# Patient Record
Sex: Female | Born: 1970 | Race: White | Hispanic: No | Marital: Married | Smoking: Never smoker
Health system: Southern US, Community
[De-identification: ages and names within clinical notes are randomized; demographics above are authoritative.]

## PROBLEM LIST (undated history)

## (undated) DIAGNOSIS — E039 Hypothyroidism, unspecified: Secondary | ICD-10-CM

## (undated) DIAGNOSIS — Z803 Family history of malignant neoplasm of breast: Secondary | ICD-10-CM

## (undated) DIAGNOSIS — C50919 Malignant neoplasm of unspecified site of unspecified female breast: Secondary | ICD-10-CM

## (undated) HISTORY — DX: Family history of malignant neoplasm of breast: Z80.3

## (undated) HISTORY — DX: Malignant neoplasm of unspecified site of unspecified female breast: C50.919

---

## 1998-02-25 ENCOUNTER — Other Ambulatory Visit: Admission: RE | Admit: 1998-02-25 | Discharge: 1998-02-25 | Payer: Self-pay | Admitting: *Deleted

## 1999-09-11 ENCOUNTER — Inpatient Hospital Stay (HOSPITAL_COMMUNITY): Admission: AD | Admit: 1999-09-11 | Discharge: 1999-09-11 | Payer: Self-pay | Admitting: *Deleted

## 1999-09-11 ENCOUNTER — Inpatient Hospital Stay (HOSPITAL_COMMUNITY): Admission: AD | Admit: 1999-09-11 | Discharge: 1999-09-16 | Payer: Self-pay | Admitting: *Deleted

## 1999-09-17 ENCOUNTER — Encounter: Admission: RE | Admit: 1999-09-17 | Discharge: 1999-12-16 | Payer: Self-pay | Admitting: *Deleted

## 1999-12-17 ENCOUNTER — Encounter: Admission: RE | Admit: 1999-12-17 | Discharge: 2000-03-16 | Payer: Self-pay | Admitting: *Deleted

## 2000-04-16 ENCOUNTER — Encounter: Admission: RE | Admit: 2000-04-16 | Discharge: 2000-05-16 | Payer: Self-pay | Admitting: *Deleted

## 2000-10-12 ENCOUNTER — Ambulatory Visit (HOSPITAL_COMMUNITY): Admission: RE | Admit: 2000-10-12 | Discharge: 2000-10-12 | Payer: Self-pay | Admitting: Obstetrics and Gynecology

## 2001-03-01 ENCOUNTER — Inpatient Hospital Stay (HOSPITAL_COMMUNITY): Admission: AD | Admit: 2001-03-01 | Discharge: 2001-03-01 | Payer: Self-pay | Admitting: Obstetrics and Gynecology

## 2001-03-01 ENCOUNTER — Encounter: Payer: Self-pay | Admitting: Obstetrics and Gynecology

## 2001-09-09 ENCOUNTER — Inpatient Hospital Stay (HOSPITAL_COMMUNITY): Admission: AD | Admit: 2001-09-09 | Discharge: 2001-09-12 | Payer: Self-pay | Admitting: Obstetrics and Gynecology

## 2001-09-13 ENCOUNTER — Encounter: Admission: RE | Admit: 2001-09-13 | Discharge: 2001-10-13 | Payer: Self-pay | Admitting: Obstetrics and Gynecology

## 2001-10-14 ENCOUNTER — Encounter: Admission: RE | Admit: 2001-10-14 | Discharge: 2001-11-13 | Payer: Self-pay | Admitting: Obstetrics and Gynecology

## 2001-12-14 ENCOUNTER — Encounter: Admission: RE | Admit: 2001-12-14 | Discharge: 2002-01-13 | Payer: Self-pay | Admitting: Obstetrics and Gynecology

## 2002-02-13 ENCOUNTER — Encounter: Admission: RE | Admit: 2002-02-13 | Discharge: 2002-03-15 | Payer: Self-pay | Admitting: Obstetrics and Gynecology

## 2002-10-22 ENCOUNTER — Other Ambulatory Visit: Admission: RE | Admit: 2002-10-22 | Discharge: 2002-10-22 | Payer: Self-pay | Admitting: Obstetrics and Gynecology

## 2003-04-27 ENCOUNTER — Other Ambulatory Visit: Admission: RE | Admit: 2003-04-27 | Discharge: 2003-04-27 | Payer: Self-pay | Admitting: Obstetrics and Gynecology

## 2003-06-10 ENCOUNTER — Inpatient Hospital Stay (HOSPITAL_COMMUNITY): Admission: AD | Admit: 2003-06-10 | Discharge: 2003-06-10 | Payer: Self-pay | Admitting: Obstetrics and Gynecology

## 2003-10-20 ENCOUNTER — Other Ambulatory Visit: Admission: RE | Admit: 2003-10-20 | Discharge: 2003-10-20 | Payer: Self-pay | Admitting: Obstetrics and Gynecology

## 2004-01-01 ENCOUNTER — Other Ambulatory Visit: Admission: RE | Admit: 2004-01-01 | Discharge: 2004-01-01 | Payer: Self-pay | Admitting: Obstetrics & Gynecology

## 2004-03-21 ENCOUNTER — Other Ambulatory Visit: Admission: RE | Admit: 2004-03-21 | Discharge: 2004-03-21 | Payer: Self-pay | Admitting: Obstetrics and Gynecology

## 2004-09-20 ENCOUNTER — Other Ambulatory Visit: Admission: RE | Admit: 2004-09-20 | Discharge: 2004-09-20 | Payer: Self-pay | Admitting: Obstetrics and Gynecology

## 2015-01-17 HISTORY — PX: BREAST BIOPSY: SHX20

## 2015-06-02 ENCOUNTER — Other Ambulatory Visit: Payer: Self-pay | Admitting: Obstetrics and Gynecology

## 2015-06-02 DIAGNOSIS — Z803 Family history of malignant neoplasm of breast: Secondary | ICD-10-CM

## 2015-06-03 ENCOUNTER — Ambulatory Visit
Admission: RE | Admit: 2015-06-03 | Discharge: 2015-06-03 | Disposition: A | Discharge status: Home / Self Care | Payer: Managed Care, Other (non HMO) | Source: Ambulatory Visit | Attending: Obstetrics and Gynecology | Admitting: Obstetrics and Gynecology

## 2015-06-03 DIAGNOSIS — Z803 Family history of malignant neoplasm of breast: Secondary | ICD-10-CM

## 2015-06-03 MED ORDER — GADOBENATE DIMEGLUMINE 529 MG/ML IV SOLN
11.0000 mL | Freq: Once | INTRAVENOUS | Status: AC | PRN
  Administered 2015-06-03: 11 mL via INTRAVENOUS

## 2015-06-15 ENCOUNTER — Other Ambulatory Visit: Payer: Self-pay | Admitting: Obstetrics and Gynecology

## 2015-06-15 DIAGNOSIS — N63 Unspecified lump in unspecified breast: Secondary | ICD-10-CM

## 2015-06-17 ENCOUNTER — Ambulatory Visit
Admission: RE | Admit: 2015-06-17 | Discharge: 2015-06-17 | Disposition: A | Discharge status: Home / Self Care | Payer: Managed Care, Other (non HMO) | Source: Ambulatory Visit | Attending: Obstetrics and Gynecology | Admitting: Obstetrics and Gynecology

## 2015-06-17 DIAGNOSIS — N63 Unspecified lump in unspecified breast: Secondary | ICD-10-CM

## 2015-06-17 MED ORDER — GADOBENATE DIMEGLUMINE 529 MG/ML IV SOLN
11.0000 mL | Freq: Once | INTRAVENOUS | Status: AC | PRN
  Administered 2015-06-17: 11 mL via INTRAVENOUS

## 2015-06-21 ENCOUNTER — Other Ambulatory Visit: Payer: Managed Care, Other (non HMO)

## 2015-12-20 ENCOUNTER — Other Ambulatory Visit: Payer: Self-pay | Admitting: Obstetrics and Gynecology

## 2015-12-20 DIAGNOSIS — R928 Other abnormal and inconclusive findings on diagnostic imaging of breast: Secondary | ICD-10-CM

## 2015-12-29 ENCOUNTER — Other Ambulatory Visit: Payer: Managed Care, Other (non HMO)

## 2016-06-27 ENCOUNTER — Other Ambulatory Visit: Payer: Self-pay | Admitting: Obstetrics and Gynecology

## 2016-06-27 DIAGNOSIS — Z803 Family history of malignant neoplasm of breast: Secondary | ICD-10-CM

## 2016-07-16 ENCOUNTER — Ambulatory Visit
Admission: RE | Admit: 2016-07-16 | Discharge: 2016-07-16 | Disposition: A | Discharge status: Home / Self Care | Payer: Managed Care, Other (non HMO) | Source: Ambulatory Visit | Attending: Obstetrics and Gynecology | Admitting: Obstetrics and Gynecology

## 2016-07-16 DIAGNOSIS — Z803 Family history of malignant neoplasm of breast: Secondary | ICD-10-CM

## 2016-07-16 MED ORDER — GADOBENATE DIMEGLUMINE 529 MG/ML IV SOLN
11.0000 mL | Freq: Once | INTRAVENOUS | Status: AC | PRN
  Administered 2016-07-16: 11 mL via INTRAVENOUS

## 2017-10-03 ENCOUNTER — Other Ambulatory Visit: Payer: Self-pay | Admitting: Obstetrics and Gynecology

## 2017-10-03 DIAGNOSIS — Z803 Family history of malignant neoplasm of breast: Secondary | ICD-10-CM

## 2017-11-30 ENCOUNTER — Ambulatory Visit (INDEPENDENT_AMBULATORY_CARE_PROVIDER_SITE_OTHER): Payer: Self-pay | Admitting: Plastic Surgery

## 2017-11-30 ENCOUNTER — Encounter: Payer: Self-pay | Admitting: Plastic Surgery

## 2017-11-30 DIAGNOSIS — Z719 Counseling, unspecified: Secondary | ICD-10-CM | POA: Insufficient documentation

## 2017-11-30 NOTE — Progress Notes (Signed)
Botulinum Toxin Procedure Note  Procedure: Cosmetic botulinum toxin  Pre-operative Diagnosis: Dynamic rhytides  Post-operative Diagnosis: Same  Complications:  None  Brief history: The patient desires botulinum toxin injection of her forehead. I discussed with the patient this proposed procedure of botulinum toxin injections, which is customized depending on the particular needs of the patient. It is performed on facial rhytids as a temporary correction. The alternatives were discussed with the patient. The risks were addressed including bleeding, scarring, infection, damage to deeper structures, asymmetry, and chronic pain, which may occur infrequently after a procedure. The individual's choice to undergo a surgical procedure is based on the comparison of risks to potential benefits. Other risks include unsatisfactory results, brow ptosis, eyelid ptosis, allergic reaction, temporary paralysis, which should go away with time, bruising, blurring disturbances and delayed healing. Botulinum toxin injections do not arrest the aging process or produce permanent tightening of the eyelid.  Operative intervention maybe necessary to maintain the results of a blepharoplasty or botulinum toxin. The patient understands and wishes to proceed. An informed consent was signed and informational brochures given to her prior to the procedure.  Procedure: The area was prepped with alcohol and dried with a clean gauze. Using a clean technique, the botulinum toxin was diluted with 1.25 cc of preservative-free normal saline which was slowly injected with an 18 gauge needle in a tuberculin syringes.  A 32 gauge needles were then used to inject the botulinum toxin. This mixture allow for an aliquot of 5 units per 0.1 cc in each injection site.    Subsequently the mixture was injected in the glabellar and forehead area with preservation of the temporal branch to the lateral eyebrow as well as into each lateral canthal area  beginning from the lateral orbital rim medial to the zygomaticus major in 3 separate areas. A total of 30 Units of botulinum toxin was used. The forehead and glabellar area was injected with care to inject intramuscular only while holding pressure on the supratrochlear vessels in each area during each injection on either side of the medial corrugators. The injection proceeded vertically superiorly to the medial 2/3 of the frontalis muscle and superior 2/3 of the lateral frontalis, again with preservation of the frontal branch. Light pressure was held for 5 minutes. She was instructed explicitly in post-operative care.  Botox LOT:  [id number] [id number] EXP:  3/22

## 2018-02-22 ENCOUNTER — Ambulatory Visit
Admission: RE | Admit: 2018-02-22 | Discharge: 2018-02-22 | Disposition: A | Discharge status: Home / Self Care | Payer: Managed Care, Other (non HMO) | Source: Ambulatory Visit | Attending: Obstetrics and Gynecology | Admitting: Obstetrics and Gynecology

## 2018-02-22 DIAGNOSIS — Z803 Family history of malignant neoplasm of breast: Secondary | ICD-10-CM

## 2018-02-22 MED ORDER — GADOBUTROL 1 MMOL/ML IV SOLN
6.0000 mL | Freq: Once | INTRAVENOUS | Status: AC | PRN
  Administered 2018-02-22: 6 mL via INTRAVENOUS

## 2018-02-23 ENCOUNTER — Other Ambulatory Visit: Payer: Managed Care, Other (non HMO)

## 2019-04-03 ENCOUNTER — Ambulatory Visit: Payer: Managed Care, Other (non HMO) | Attending: Family

## 2019-04-03 DIAGNOSIS — Z23 Encounter for immunization: Secondary | ICD-10-CM

## 2019-04-03 NOTE — Progress Notes (Signed)
   Covid-19 Vaccination Clinic  Name:  Tyreesha Wahl    MRN: [id number] DOB: 1970-06-18  04/03/2019  Ms. Odoherty was observed post Covid-19 immunization for 15 minutes without incident. She was provided with Vaccine Information Sheet and instruction to access the V-Safe system.   Ms. Cofrancesco was instructed to call 911 with any severe reactions post vaccine: Marland Kitchen Difficulty breathing  . Swelling of face and throat  . A fast heartbeat  . A bad rash all over body  . Dizziness and weakness   Immunizations Administered    Name Date Dose VIS Date Route   Moderna COVID-19 Vaccine 04/03/2019  2:53 PM 0.5 mL 12/17/2018 Intramuscular   Manufacturer: Moderna   Lot: [id number]   [id number]

## 2019-05-06 ENCOUNTER — Ambulatory Visit: Payer: Self-pay | Attending: Family

## 2019-05-06 DIAGNOSIS — Z23 Encounter for immunization: Secondary | ICD-10-CM

## 2019-05-06 NOTE — Progress Notes (Signed)
   Covid-19 Vaccination Clinic  Name:  Noralba Rosencrantz    MRN: [id number] DOB: Aug 11, 1970  05/06/2019  Ms. Lowe was observed post Covid-19 immunization for 15 minutes without incident. She was provided with Vaccine Information Sheet and instruction to access the V-Safe system.   Ms. Poore was instructed to call 911 with any severe reactions post vaccine: Marland Kitchen Difficulty breathing  . Swelling of face and throat  . A fast heartbeat  . A bad rash all over body  . Dizziness and weakness   Immunizations Administered    Name Date Dose VIS Date Route   Moderna COVID-19 Vaccine 05/06/2019 10:03 AM 0.5 mL 12/2018 Intramuscular   Manufacturer: Moderna   Lot: [id number]   [place][id number]

## 2020-05-12 ENCOUNTER — Other Ambulatory Visit: Payer: Self-pay | Admitting: Obstetrics and Gynecology

## 2020-05-12 DIAGNOSIS — Z803 Family history of malignant neoplasm of breast: Secondary | ICD-10-CM

## 2020-05-30 ENCOUNTER — Other Ambulatory Visit: Payer: Self-pay

## 2020-06-02 ENCOUNTER — Ambulatory Visit
Admission: RE | Admit: 2020-06-02 | Discharge: 2020-06-02 | Disposition: A | Discharge status: Home / Self Care | Payer: No Typology Code available for payment source | Source: Ambulatory Visit | Attending: Obstetrics and Gynecology | Admitting: Obstetrics and Gynecology

## 2020-06-02 ENCOUNTER — Other Ambulatory Visit: Payer: Self-pay

## 2020-06-02 DIAGNOSIS — Z803 Family history of malignant neoplasm of breast: Secondary | ICD-10-CM

## 2020-06-02 MED ORDER — GADOBUTROL 1 MMOL/ML IV SOLN
6.0000 mL | Freq: Once | INTRAVENOUS | Status: AC | PRN
  Administered 2020-06-02: 6 mL via INTRAVENOUS

## 2020-06-04 ENCOUNTER — Other Ambulatory Visit: Payer: Self-pay | Admitting: Obstetrics and Gynecology

## 2020-06-04 DIAGNOSIS — R9389 Abnormal findings on diagnostic imaging of other specified body structures: Secondary | ICD-10-CM

## 2020-06-10 ENCOUNTER — Ambulatory Visit
Admission: RE | Admit: 2020-06-10 | Discharge: 2020-06-10 | Disposition: A | Discharge status: Home / Self Care | Payer: No Typology Code available for payment source | Source: Ambulatory Visit | Attending: Obstetrics and Gynecology | Admitting: Obstetrics and Gynecology

## 2020-06-10 ENCOUNTER — Other Ambulatory Visit: Payer: Self-pay

## 2020-06-10 DIAGNOSIS — R9389 Abnormal findings on diagnostic imaging of other specified body structures: Secondary | ICD-10-CM

## 2020-06-11 ENCOUNTER — Other Ambulatory Visit: Payer: Self-pay | Admitting: Obstetrics and Gynecology

## 2020-06-11 DIAGNOSIS — N63 Unspecified lump in unspecified breast: Secondary | ICD-10-CM

## 2020-06-16 ENCOUNTER — Other Ambulatory Visit: Payer: No Typology Code available for payment source

## 2020-06-23 ENCOUNTER — Ambulatory Visit
Admission: RE | Admit: 2020-06-23 | Discharge: 2020-06-23 | Disposition: A | Discharge status: Home / Self Care | Payer: No Typology Code available for payment source | Source: Ambulatory Visit | Attending: Obstetrics and Gynecology | Admitting: Obstetrics and Gynecology

## 2020-06-23 ENCOUNTER — Other Ambulatory Visit (HOSPITAL_COMMUNITY): Payer: Self-pay | Admitting: Diagnostic Radiology

## 2020-06-23 ENCOUNTER — Other Ambulatory Visit: Payer: Self-pay

## 2020-06-23 DIAGNOSIS — N63 Unspecified lump in unspecified breast: Secondary | ICD-10-CM

## 2020-06-23 HISTORY — PX: BREAST BIOPSY: SHX20

## 2020-06-23 MED ORDER — GADOBUTROL 1 MMOL/ML IV SOLN
5.0000 mL | Freq: Once | INTRAVENOUS | Status: AC | PRN
  Administered 2020-06-23: 5 mL via INTRAVENOUS

## 2020-06-25 ENCOUNTER — Other Ambulatory Visit: Payer: Self-pay | Admitting: Obstetrics and Gynecology

## 2020-06-25 ENCOUNTER — Telehealth: Payer: Self-pay | Admitting: Hematology and Oncology

## 2020-06-25 DIAGNOSIS — C50912 Malignant neoplasm of unspecified site of left female breast: Secondary | ICD-10-CM

## 2020-06-25 NOTE — Telephone Encounter (Signed)
Spoke to patient to confirm morning [facility] appointment for 6/22, packet sent via email

## 2020-06-26 ENCOUNTER — Ambulatory Visit
Admission: RE | Admit: 2020-06-26 | Discharge: 2020-06-26 | Disposition: A | Discharge status: Home / Self Care | Payer: No Typology Code available for payment source | Source: Ambulatory Visit | Attending: Obstetrics and Gynecology | Admitting: Obstetrics and Gynecology

## 2020-06-26 ENCOUNTER — Encounter: Payer: Self-pay | Admitting: Radiology

## 2020-06-26 DIAGNOSIS — C50912 Malignant neoplasm of unspecified site of left female breast: Secondary | ICD-10-CM

## 2020-07-01 ENCOUNTER — Encounter: Payer: Self-pay | Admitting: *Deleted

## 2020-07-01 DIAGNOSIS — D0512 Intraductal carcinoma in situ of left breast: Secondary | ICD-10-CM

## 2020-07-06 NOTE — Progress Notes (Signed)
[place] CONSULT NOTE  Patient Care Team: Zane Herald, MD as PCP - General (Family Medicine) Rockwell Germany, RN as Oncology Nurse Navigator Mauro Kaufmann, RN as Oncology Nurse Navigator Donnie Mesa, MD as Consulting Physician (General Surgery) Nicholas Lose, MD as Consulting Physician (Hematology and Oncology) Kyung Rudd, MD as Consulting Physician (Radiation Oncology)  CHIEF COMPLAINTS/PURPOSE OF CONSULTATION:  Newly diagnosed left DCIS  HISTORY OF PRESENTING ILLNESS:  Amanda Hawkins 50 y.o. female is here because of recent diagnosis of DCIS of the left breast. She has a family history of breast cancer including her mother diagnosed at age 61. High-risk screening breast MRI on 06/03/20 showed indeterminate 0.6 cm oval enhancing mass in the central upper left breast and no evidence of malignancy in the right breast. 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 Diagnostic mammogram and [place] on 06/26/20 showed new biopsy clip within the retroareolar to slightly outer left breast, corresponding to the recent biopsy-proven DCIS and prominent post biopsy hematoma at and anterior to the biopsy site, and no evidence of malignancy in the right breast. She presents to the clinic today for initial evaluation and discussion of treatment options.   I reviewed her records extensively and collaborated the history with the patient.  SUMMARY OF ONCOLOGIC HISTORY: Oncology History  Ductal carcinoma in situ (DCIS) of left breast  06/23/2020 Initial Diagnosis   High-risk screening breast MRI on 06/03/20 showed indeterminate 0.6 cm oval enhancing mass in the central upper left breast and no evidence of malignancy in the right breast. 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      MEDICAL HISTORY:  Past Medical History:  Diagnosis Date   Breast cancer [facility])      SURGICAL HISTORY: Past Surgical History:  Procedure Laterality Date   BREAST BIOPSY Left 06/23/2020   MRI   BREAST BIOPSY Right 2017   CESAREAN SECTION      SOCIAL HISTORY: Social History   Socioeconomic History   Marital status: Married    Spouse name: Not on file   Number of children: Not on file   Years of education: Not on file   Highest education level: Not on file  Occupational History   Not on file  Tobacco Use   Smoking status: Never   Smokeless tobacco: Never  Substance and Sexual Activity   Alcohol use: Yes    Comment: 10   Drug use: Never   Sexual activity: Not on file  Other Topics Concern   Not on file  Social History Narrative   Not on file   Social Determinants of Health   Financial Resource Strain: Not on file  Food Insecurity: Not on file  Transportation Needs: Not on file  Physical Activity: Not on file  Stress: Not on file  Social Connections: Not on file  Intimate Partner Violence: Not on file    FAMILY HISTORY: Family History  Problem Relation Age of Onset   Breast cancer Mother 69    ALLERGIES:  has no allergies on file.  MEDICATIONS:  Current Outpatient Medications  Medication Sig Dispense Refill   TESTOSTERONE NA testosterone 2% cream  APPLY 1 OR 2 CLICKS (3.97-6.7 GRAMS) BEHIND THE KNEE DAILY.     thyroid (NP THYROID) 60 MG tablet NP Thyroid 60 mg tablet  TAKE 1 TABLET ONCE DAILY ON EVEN DAYS AND TAKE 1&1/2 TABLETS ONCE  DAILY ON ODD DAYS.     No current facility-administered medications for this visit.    REVIEW OF SYSTEMS:   Constitutional: Denies fevers, chills or abnormal night sweats Eyes: Denies blurriness of vision, double vision or watery eyes Ears, nose, mouth, throat, and face: Denies mucositis or sore throat Respiratory: Denies cough, dyspnea or wheezes Cardiovascular: Denies palpitation, chest discomfort or lower extremity swelling Gastrointestinal:  Denies nausea, heartburn or change in bowel habits Skin:  Denies abnormal skin rashes Lymphatics: Denies new lymphadenopathy or easy bruising Neurological:Denies numbness, tingling or new weaknesses Behavioral/Psych: Mood is stable, no new changes   All other systems were reviewed with the patient and are negative.  PHYSICAL EXAMINATION: ECOG PERFORMANCE STATUS: 0 - Asymptomatic  Vitals:   07/07/20 0851  BP: 117/86  Pulse: 99  Resp: 18  Temp: 97.8 F (36.6 C)  SpO2: 100%   Filed Weights   07/07/20 0851  Weight: 132 lb 6.4 oz (60.1 kg)      LABORATORY DATA:  I have reviewed the data as listed Lab Results  Component Value Date   WBC 6.5 07/07/2020   HGB 14.5 07/07/2020   HCT 42.9 07/07/2020   MCV 96.6 07/07/2020   PLT 181 07/07/2020   Lab Results  Component Value Date   NA 140 07/07/2020   K 4.3 07/07/2020   CL 105 07/07/2020   CO2 22 07/07/2020    RADIOGRAPHIC STUDIES: I have personally reviewed the radiological reports and agreed with the findings in the report.  ASSESSMENT AND PLAN:  Ductal carcinoma in situ (DCIS) of left breast 06/23/2020: High risk screening breast MRI revealed indeterminate 0.6 cm oval mass in the left breast.  Biopsy revealed low-grade DCIS ER/PR 100% positive.  Pathology review: I discussed with the patient the difference between DCIS and invasive breast cancer. It is considered a precancerous lesion. DCIS is classified as a 0. It is generally detected through mammograms as calcifications. We discussed the significance of grades and its impact on prognosis. We also discussed the importance of ER and PR receptors and their implications to adjuvant treatment options. Prognosis of DCIS dependence on grade, comedo necrosis. It is anticipated that if not treated, 20-30% of DCIS can develop into invasive breast cancer.  Recommendation: 1. Breast conserving surgery versus bilateral mastectomies and reconstruction 2. Followed by adjuvant radiation therapy 3. Followed by antiestrogen therapy with tamoxifen  5 years (if she has bilateral mastectomies she will not need radiation or antiestrogen therapy)  Tamoxifen counseling: We discussed the risks and benefits of tamoxifen. These include but not limited to insomnia, hot flashes, mood changes, vaginal dryness, and weight gain. Although rare, serious side effects including endometrial cancer, risk of blood clots were also discussed. We strongly believe that the benefits far outweigh the risks. Patient understands these risks and consented to starting treatment. Planned treatment duration is 5 years.  Return to clinic after surgery to discuss the final pathology report and come up with an adjuvant treatment plan.    All questions were answered. The patient knows to call the clinic with any problems, questions or concerns.   Rulon Eisenmenger, MD, MPH 07/07/2020    I, Thana Ates, am acting as scribe for Nicholas Lose, MD.  I have reviewed the above documentation for accuracy and completeness, and I agree with the above.

## 2020-07-07 ENCOUNTER — Inpatient Hospital Stay: Payer: No Typology Code available for payment source

## 2020-07-07 ENCOUNTER — Other Ambulatory Visit: Payer: Self-pay

## 2020-07-07 ENCOUNTER — Inpatient Hospital Stay (HOSPITAL_BASED_OUTPATIENT_CLINIC_OR_DEPARTMENT_OTHER): Payer: No Typology Code available for payment source | Admitting: Hematology and Oncology

## 2020-07-07 ENCOUNTER — Encounter: Payer: Self-pay | Admitting: Physical Therapy

## 2020-07-07 ENCOUNTER — Ambulatory Visit
Admission: RE | Admit: 2020-07-07 | Discharge: 2020-07-07 | Disposition: A | Discharge status: Home / Self Care | Payer: No Typology Code available for payment source | Source: Ambulatory Visit | Attending: Radiation Oncology | Admitting: Radiation Oncology

## 2020-07-07 ENCOUNTER — Ambulatory Visit: Payer: Self-pay | Admitting: Surgery

## 2020-07-07 ENCOUNTER — Ambulatory Visit: Payer: No Typology Code available for payment source | Attending: Surgery | Admitting: Physical Therapy

## 2020-07-07 ENCOUNTER — Encounter: Payer: Self-pay | Admitting: Hematology and Oncology

## 2020-07-07 ENCOUNTER — Encounter: Payer: Self-pay | Admitting: Genetic Counselor

## 2020-07-07 ENCOUNTER — Encounter: Payer: Self-pay | Admitting: Radiation Oncology

## 2020-07-07 ENCOUNTER — Ambulatory Visit (HOSPITAL_BASED_OUTPATIENT_CLINIC_OR_DEPARTMENT_OTHER): Payer: No Typology Code available for payment source | Admitting: Genetic Counselor

## 2020-07-07 DIAGNOSIS — Z170 Estrogen receptor positive status [ER+]: Secondary | ICD-10-CM

## 2020-07-07 DIAGNOSIS — D0512 Intraductal carcinoma in situ of left breast: Secondary | ICD-10-CM

## 2020-07-07 DIAGNOSIS — Z803 Family history of malignant neoplasm of breast: Secondary | ICD-10-CM | POA: Insufficient documentation

## 2020-07-07 DIAGNOSIS — C50919 Malignant neoplasm of unspecified site of unspecified female breast: Secondary | ICD-10-CM | POA: Insufficient documentation

## 2020-07-07 LAB — CBC WITH DIFFERENTIAL (CANCER CENTER ONLY)
Abs Immature Granulocytes: 0.04 10*3/uL (ref 0.00–0.07)
Basophils Absolute: 0 10*3/uL (ref 0.0–0.1)
Basophils Relative: 1 %
Eosinophils Absolute: 0.1 10*3/uL (ref 0.0–0.5)
Eosinophils Relative: 2 %
HCT: 42.9 % (ref 36.0–46.0)
Hemoglobin: 14.5 g/dL (ref 12.0–15.0)
Immature Granulocytes: 1 %
Lymphocytes Relative: 30 %
Lymphs Abs: 1.9 10*3/uL (ref 0.7–4.0)
MCH: 32.7 pg (ref 26.0–34.0)
MCHC: 33.8 g/dL (ref 30.0–36.0)
MCV: 96.6 fL (ref 80.0–100.0)
Monocytes Absolute: 0.6 10*3/uL (ref 0.1–1.0)
Monocytes Relative: 9 %
Neutro Abs: 3.8 10*3/uL (ref 1.7–7.7)
Neutrophils Relative %: 57 %
Platelet Count: 181 10*3/uL (ref 150–400)
RBC: 4.44 MIL/uL (ref 3.87–5.11)
RDW: 12.5 % (ref 11.5–15.5)
WBC Count: 6.5 10*3/uL (ref 4.0–10.5)
nRBC: 0 % (ref 0.0–0.2)

## 2020-07-07 LAB — CMP (CANCER CENTER ONLY)
ALT: 32 U/L (ref 0–44)
AST: 27 U/L (ref 15–41)
Albumin: 4.3 g/dL (ref 3.5–5.0)
Alkaline Phosphatase: 36 U/L — ABNORMAL LOW (ref 38–126)
Anion gap: 13 (ref 5–15)
BUN: 19 mg/dL (ref 6–20)
CO2: 22 mmol/L (ref 22–32)
Calcium: 9.7 mg/dL (ref 8.9–10.3)
Chloride: 105 mmol/L (ref 98–111)
Creatinine: 0.86 mg/dL (ref 0.44–1.00)
GFR, Estimated: >60 mL/min (ref >60)
Glucose, Bld: 99 mg/dL (ref 70–99)
Potassium: 4.3 mmol/L (ref 3.5–5.1)
Sodium: 140 mmol/L (ref 135–145)
Total Bilirubin: 1.4 mg/dL — ABNORMAL HIGH (ref 0.3–1.2)
Total Protein: 7.2 g/dL (ref 6.5–8.1)

## 2020-07-07 LAB — GENETIC SCREENING ORDER

## 2020-07-07 NOTE — H&P (Signed)
History of Present Illness Amanda Hawkins. Amanda Bonfield MD; 07/07/2020 11:45 AM) The patient is a 50 year old female who presents with breast cancer. Breast MDC 07/07/20 Amanda Hawkins  This is a 50 year old female in good health who presents with a family history of breast cancer in her mother at age 39 and again at age 52.  No previous genetic testing.  She has been screened over the last few years with MRI for high-risk.  A few years ago, she had a right breast biopsy that was benign.  Recently, she was found to have a suspicious 6 mm area in the left upper central breast.  Biopsy revealed DCIS - low grade, ER/PR +.  She presents today for treatment discussion.  MRI and mammogram show no other suspicious areas.  At the beginning of our discussion, she is already leaning towards bilateral mastectomies with reconstruction.    CLINICAL DATA:  50 year old female with a family history of breast cancer including in her mother diagnosed at age 36. Prior benign MRI biopsy of the right breast 2017 with pathology revealing fibrocystic change with adenosis, calcifications, PASH and usual ductal hyperplasia. High risk screening breast MRI.  LABS:  Not applicable.  EXAM: BILATERAL BREAST MRI WITH AND WITHOUT CONTRAST  TECHNIQUE: Multiplanar, multisequence MR images of both breasts were obtained prior to and following the intravenous administration of 6 ml of Gadavist  Three-dimensional MR images were rendered by post-processing of the original MR data on an independent workstation. The three-dimensional MR images were interpreted, and findings are reported in the following complete MRI report for this study. Three dimensional images were evaluated at the independent interpreting workstation using the DynaCAD thin client.  COMPARISON:  Previous exams.  FINDINGS: Breast composition: c.  Heterogeneous fibroglandular tissue.  Background parenchymal enhancement: Mild.  Right breast: No suspicious enhancing  masses or abnormal areas of enhancement in the right breast to suggest malignancy. Biopsy clip artifact noted in the outer right breast at site of prior benign MRI guided biopsy.  Left breast: There is an oval enhancing mass in the central upper left breast measuring 0.6 cm (subtraction image 74). No additional suspicious areas of enhancement identified in the left breast.  Lymph nodes: No abnormal appearing lymph nodes.  Ancillary findings:  None.  IMPRESSION: 1. Indeterminate 0.6 cm oval enhancing mass in the central upper left breast.  2.  No MRI evidence of malignancy in the right breast.  RECOMMENDATION: Recommend the patient return for a second-look ultrasound to evaluate for a sonographic correlate for the 0.6 cm enhancing mass in the central upper left breast. If this mass can be identified sonographically it would be easier to biopsy under ultrasound guidance given patient's breast size however if this mass cannot be found sonographically than proceeding with MRI guided biopsy is recommended.  BI-RADS CATEGORY  4: Suspicious.   Electronically Signed  By: Amanda Hawkins M.D.  On: 06/03/2020 13:27    Past Surgical History Amanda Slipper, RN; 07/07/2020 7:59 AM) Breast Biopsy  multiple Cesarean Section - Multiple   Diagnostic Studies History Amanda Slipper, RN; 07/07/2020 7:59 AM) Colonoscopy  never Mammogram  within last year Pap Smear  1-5 years ago  Allergies Amanda Key K. Nikodem Leadbetter, MD; 07/07/2020 11:45 AM) No Known Allergies  [07/06/2020]:  Medication History Amanda Hawkins. Amanda Farone, MD; 07/07/2020 11:45 AM) Medications Reconciled  ALPRAZolam  (0.25MG  Tablet, Oral) Active. NP Thyroid  (60MG  Tablet, Oral) Active. valACYclovir HCl  (1GM Tablet, Oral) Active. Testosterone  Active. Clindamycin Phosphate  (  1% Lotion, External) Active.  Social History Amanda Slipper, RN; 07/07/2020 7:59 AM) Alcohol use  Occasional alcohol use. Caffeine use  Coffee. No drug use  Tobacco  use  Never smoker.  Family History Amanda Slipper, RN; 07/07/2020 7:59 AM) Arthritis  Father. Breast Cancer  Mother. Cancer  Mother. Colon Polyps  Mother.  Pregnancy / Birth History Amanda Slipper, RN; 07/07/2020 7:59 AM) Age at menarche  51 years. Gravida  3 Irregular periods  Length (months) of breastfeeding  3-6 Maternal age  39-30 Para  2  Other Problems Amanda Slipper, RN; 07/07/2020 7:59 AM) Breast Cancer      Review of Systems Amanda Slipper RN; 07/07/2020 7:59 AM) General Not Present- Appetite Loss, Chills, Fatigue, Fever, Night Sweats, Weight Gain and Weight Loss. Skin Not Present- Change in Wart/Mole, Dryness, Hives, Jaundice, New Lesions, Non-Healing Wounds, Rash and Ulcer. HEENT Present- Wears glasses/contact lenses. Not Present- Earache, Hearing Loss, Hoarseness, Nose Bleed, Oral Ulcers, Ringing in the Ears, Seasonal Allergies, Sinus Pain, Sore Throat, Visual Disturbances and Yellow Eyes. Respiratory Not Present- Bloody sputum, Chronic Cough, Difficulty Breathing, Snoring and Wheezing. Breast Not Present- Breast Mass, Breast Pain, Nipple Discharge and Skin Changes. Cardiovascular Not Present- Chest Pain, Difficulty Breathing Lying Down, Leg Cramps, Palpitations, Rapid Heart Rate, Shortness of Breath and Swelling of Extremities. Gastrointestinal Not Present- Abdominal Pain, Bloating, Bloody Stool, Change in Bowel Habits, Chronic diarrhea, Constipation, Difficulty Swallowing, Excessive gas, Gets full quickly at meals, Hemorrhoids, Indigestion, Nausea, Rectal Pain and Vomiting. Female Genitourinary Not Present- Frequency, Nocturia, Painful Urination, Pelvic Pain and Urgency. Musculoskeletal Not Present- Back Pain, Joint Pain, Joint Stiffness, Muscle Pain, Muscle Weakness and Swelling of Extremities. Neurological Not Present- Decreased Memory, Fainting, Headaches, Numbness, Seizures, Tingling, Tremor, Trouble walking and Weakness. Psychiatric Not Present- Anxiety, Bipolar, Change in  Sleep Pattern, Depression, Fearful and Frequent crying. Endocrine Not Present- Cold Intolerance, Excessive Hunger, Hair Changes, Heat Intolerance, Hot flashes and New Diabetes. Hematology Not Present- Blood Thinners, Easy Bruising, Excessive bleeding, Gland problems, HIV and Persistent Infections.   Physical Exam Amanda Key K. Ellenore Roscoe MD; 07/07/2020 11:46 AM)  The physical exam findings are as follows: Note: Constitutional: WDWN in NAD, conversant, no obvious deformities; resting comfortably Eyes: Pupils equal, round; sclera anicteric; moist conjunctiva; no lid lag HENT: Oral mucosa moist; good dentition Neck: No masses palpated, trachea midline; no thyromegaly Lungs: CTA bilaterally; normal respiratory effort Breasts: symmetric; bruising in left breast from previous biopsy; no other palpable masses; no axillary lymphadenopathy; no nipple changes CV: Regular rate and rhythm; no murmurs; extremities well-perfused with no edema Abd: +bowel sounds, soft, non-tender, no palpable organomegaly; no palpable hernias Musc: Normal gait; no apparent clubbing or cyanosis in extremities Lymphatic: No palpable cervical or axillary lymphadenopathy Skin: Warm, dry; no sign of jaundice Psychiatric - alert and oriented x 4; calm mood and affect    Assessment & Plan Amanda Key K. 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 Routine. Note: The patient has a relatively small area of DCIS that would be amenable to radioactive seed localized lumpectomy.  However, she has other considerations that make her lean towards bilateral mastectomies.   Genetics are pending.  With bilateral mastectomies, she would not likely need radiation or anti-estrogens.  She has a lot of anxiety related to high-risk screening and repeated biopsies, especially at her age and with her family history.    We  discussed both  surgical options - left radioactive seed localized lumpectomy as well as bilateral mastectomy.  If we pursue mastectomies, she seems to be a good candidate for bilateral NSM as well as left axillary SLNB.  We will place an urgent referral to Dr. Iran Hawkins for evaluation while we are awaiting Genetics.  We will discuss this further after Genetics/ Plastics.  Amanda Hawkins. Amanda Dover, MD, [facility]  General/ Trauma Surgery   07/07/2020 11:51 AM

## 2020-07-07 NOTE — Progress Notes (Signed)
Radiation Oncology         [phone] ________________________________  Name: Amanda Hawkins        MRN: [id number]  Date of Service: 07/07/2020 DOB: 1970/06/12  [id number], Weston Brass, MD  Donnie Mesa, MD     REFERRING PHYSICIAN: Donnie Mesa, MD   DIAGNOSIS: The encounter diagnosis was Ductal carcinoma in situ (DCIS) of left breast.   HISTORY OF PRESENT ILLNESS: Amanda Hawkins is a 50 y.o. female seen in the multidisciplinary breast clinic for a new diagnosis of left breast cancer. The patient was noted to have a screening abnormality on MRI as she has a strong family history of breast cancer in her mother. The MRI showed a 6 mm lesion in the left breast and her axillae and right breast were negative for concerns. She had an MRI guided biopsy on 06/23/20 showed a low grade DCIS that was ER/PR positive. She's seen today to discuss treatment recommendations for her cancer.     PREVIOUS RADIATION THERAPY: No   PAST MEDICAL HISTORY: No past medical history on file.     PAST SURGICAL HISTORY: Past Surgical History:  Procedure Laterality Date   BREAST BIOPSY Left 06/23/2020   MRI   BREAST BIOPSY Right 2017     FAMILY HISTORY:  Family History  Problem Relation Age of Onset   Breast cancer Mother 24     SOCIAL HISTORY:  reports that she has never smoked. She has never used smokeless tobacco. The patient is married and lives in [place]. 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 She has 4 children. Her contraception method is her husband's vasectomy.   ALLERGIES: Patient has no allergy information on record.   MEDICATIONS:  No current outpatient medications on file.   No current facility-administered medications for this encounter.     REVIEW OF SYSTEMS: On review of systems, the patient reports that she is doing well but has had some bruising from her biopsy. No other concerns are verbalized.      PHYSICAL EXAM:  Wt Readings from Last 3 Encounters:   11/30/17 130 lb (59 kg)  06/03/15 127 lb (57.6 kg)   Temp Readings from Last 3 Encounters:  No data found for Temp   BP Readings from Last 3 Encounters:  11/30/17 110/60   Pulse Readings from Last 3 Encounters:  11/30/17 99    In general this is a well appearing caucasian female in no acute distress. She's alert and oriented x4 and appropriate throughout the examination. Cardiopulmonary assessment is negative for acute distress and she exhibits normal effort. Bilateral breast exam is deferred.    ECOG = 0  0 - Asymptomatic (Fully active, able to carry on all predisease activities without restriction)  1 - Symptomatic but completely ambulatory (Restricted in physically strenuous activity but ambulatory and able to carry out work of a light or sedentary nature. For example, light housework, office work)  2 - Symptomatic, <50% in bed during the day (Ambulatory and capable of all self care but unable to carry out any work activities. Up and about more than 50% of waking hours)  3 - Symptomatic, >50% in bed, but not bedbound (Capable of only limited self-care, confined to bed or chair 50% or more of waking hours)  4 - Bedbound (Completely disabled. Cannot carry on any self-care. Totally confined to bed or chair)  5 - Death   Eustace Pen MM, Creech RH, Tormey DC, et al. [phone]). "Toxicity and response criteria of  the [facility]". [place] Oncol. 5 (6): 649-55    LABORATORY DATA:  No results found for: WBC, HGB, HCT, MCV, PLT No results found for: NA, K, CL, CO2 No results found for: ALT, AST, GGT, ALKPHOS, BILITOT    RADIOGRAPHY: US BREAST LTD UNI LEFT INC AXILLA  Result Date: 06/10/2020 CLINICAL DATA:  Patient with recent breast MRI demonstrating a 6 mm mass within the central left breast, for second-look ultrasound. EXAM: ULTRASOUND OF THE LEFT BREAST COMPARISON:  Previous exam(s). FINDINGS: On physical exam, no discrete mass palpated superior left breast.  Targeted ultrasound is performed, showing normal tissue without suspicious mass within the superior left breast. No discrete mass identified to correspond with the mass identified on recent MRI. IMPRESSION: No sonographic correlate for the mass identified on recent MRI. RECOMMENDATION: MRI guided core needle biopsy of the mass within the left breast. I have discussed the findings and recommendations with the patient. If applicable, a reminder letter will be sent to the patient regarding the next appointment. BI-RADS CATEGORY  2: Benign. Electronically Signed   By: Lovey Newcomer M.D.   On: 06/10/2020 15:46   MM DIAG BREAST TOMO BILATERAL  Result Date: 06/26/2020 CLINICAL DATA:  Recently diagnosed LEFT breast cancer via MRI guided biopsy. Ordering physician requests a current mammogram. EXAM: DIGITAL DIAGNOSTIC BILATERAL MAMMOGRAM WITH TOMOSYNTHESIS AND CAD TECHNIQUE: Bilateral digital diagnostic mammography and breast tomosynthesis was performed. The images were evaluated with computer-aided detection. COMPARISON:  Previous exams including breast MRI dated 06/02/2020, MRI guided biopsy dated 06/23/2020 and post clip mammogram dated 06/23/2020, and most recent bilateral screening mammogram dated 12/01/2019. ACR Breast Density Category c: The breast tissue is heterogeneously dense, which may obscure small masses. FINDINGS: Bilateral diagnostic mammogram: RIGHT breast: There are no new dominant masses, suspicious calcifications or secondary signs of malignancy within the RIGHT breast. Biopsy clip within the RIGHT breast corresponds to a previous benign biopsy site. LEFT breast: New biopsy clip within the retroareolar to slightly outer LEFT breast, corresponding to the recent biopsy-proven DCIS. There is a prominent post biopsy hematoma at and anterior to the biopsy site. There are no new dominant masses, suspicious calcifications or secondary signs of malignancy elsewhere within the LEFT breast. IMPRESSION: 1. New  biopsy clip within the retroareolar to slightly outer LEFT breast, corresponding to recent biopsy-proven DCIS. 2. Prominent post biopsy hematoma at and anterior to the biopsy site. 3. No evidence of malignancy within the RIGHT breast. RECOMMENDATION: Per treatment plan for patient's known LEFT breast DCIS. I have discussed the findings and recommendations with the patient. If applicable, a reminder letter will be sent to the patient regarding the next appointment. BI-RADS CATEGORY  6: Known biopsy-proven malignancy. Electronically Signed   By: Franki Cabot M.D.   On: 06/26/2020 07:59  MM CLIP PLACEMENT LEFT  Result Date: 06/23/2020 CLINICAL DATA:  Status post MR guided core needle biopsy of a 6 mm mass in the upper central left breast. EXAM: DIAGNOSTIC LEFT MAMMOGRAM POST MRI BIOPSY COMPARISON:  Previous exam(s). FINDINGS: Mammographic images were obtained following MRI guided biopsy of the recently demonstrated 6 mm mass in the upper central left breast. The biopsy marking clip is in expected position at the site of biopsy. IMPRESSION: Appropriate positioning of the dumbbell shaped biopsy marking clip at the site of biopsy in the upper central left breast. Final Assessment: Post Procedure Mammograms for Marker Placement Electronically Signed   By: Claudie Revering M.D.   On: 06/23/2020 09:27  MR LT BREAST BX W LOC DEV 1ST LESION IMAGE BX SPEC MR GUIDE  Addendum Date: 06/25/2020   ADDENDUM REPORT: 06/25/2020 12:47 ADDENDUM: Pathology revealed LOW GRADE DUCTAL CARCINOMA IN SITU of the LEFT breast, upper central. This was found to be concordant by Dr. Claudie Revering. Pathology results were discussed with the patient by telephone. The patient reported doing well after the biopsy with tenderness at the site. Post biopsy instructions and care were reviewed and questions were answered. The patient was encouraged to call [facility] for any additional concerns. My direct phone number was  provided. The patient was referred to [facility] on July 07, 2020, per patient request. The patient is scheduled for a BILATERAL diagnostic mammogram at [facility] on June 26, 2020. Pathology results reported by Terie Purser, RN on 06/25/2020. Electronically Signed   By: Claudie Revering M.D.   On: 06/25/2020 12:47   Result Date: 06/25/2020 CLINICAL DATA:  6 mm indeterminate mass in the upper central left breast on a recent screening MRI of the breasts. EXAM: MRI GUIDED CORE NEEDLE BIOPSY OF THE LEFT BREAST TECHNIQUE: Multiplanar, multisequence MR imaging of the left breast was performed both before and after administration of intravenous contrast. CONTRAST:  6 cc Gadavist COMPARISON:  Previous exams. FINDINGS: I met with the patient, and we discussed the procedure of MRI guided biopsy, including risks, benefits, and alternatives. Specifically, we discussed the risks of infection, bleeding, tissue injury, clip migration, and inadequate sampling. Informed, written consent was given. The usual time out protocol was performed immediately prior to the procedure. Using sterile technique, 1% Lidocaine, MRI guidance, and a 9 gauge vacuum assisted device, biopsy was performed of the recently demonstrated 6 mm mass in the upper central left breast using a lateral approach. At the conclusion of the procedure, a a barbell shaped tissue marker clip was deployed into the biopsy cavity. Follow-up 2-view mammogram was performed and dictated separately. IMPRESSION: MRI guided biopsy of the recently demonstrated 6 mm mass in the upper central left breast. No apparent complications. Electronically Signed: By: Claudie Revering M.D. On: 06/23/2020 09:13      IMPRESSION/PLAN: 1. Low Grade ER/PR positive DCIS of the left breast. Dr. Lisbeth Renshaw discusses the pathology findings and reviews the nature of noninvasive breast disease. The consensus from the breast  conference includes breast conservation with lumpectomy. She is interested in considering her options however of mastectomies given her family and now personal history. Dr. Lisbeth Renshaw discusses that if she had breast conserving surgery, he would recommend external radiotherapy to the breast  to reduce risks of local recurrence followed by antiestrogen therapy. We discussed the risks, benefits, short, and long term effects of radiotherapy, as well as the curative intent as well as would offer a 6 1/2 week course of radiotherapy to the left breast with deep inspiration breath hold technique. Dr. Lisbeth Renshaw does not anticipate a role for radiotherapy following mastectomy if she were to choose this approach. We will see her back based on her surgical decision making.  2. Contraceptive Counseling. The patient is having cycles but would not need pregnancy testing as her contraceptive method is her husband's vasectomy. 3. Possible genetic predisposition to malignancy. The patient is a candidate for genetic testing given her personal and family history. She has had BRCA testing that was negative more than 10 years ago, but was offered referral and would like to update her testing  and meet genetic counseling today.   In a visit lasting 60 minutes, greater than 50% of the time was spent face to face reviewing her case, as well as in preparation of, discussing, and coordinating the patient's care.  The above documentation reflects my direct findings during this shared patient visit. Please see the separate note by Dr. Lisbeth Renshaw on this date for the remainder of the patient's plan of care.    Carola Rhine, [facility]    **Disclaimer: This note was dictated with voice recognition software. Similar sounding words can inadvertently be transcribed and this note may contain transcription errors which may not have been corrected upon publication of note.**

## 2020-07-07 NOTE — Progress Notes (Deleted)
REFERRING PROVIDER: Nicholas Lose, MD [place],  [place] [phone]  PRIMARY PROVIDER:  Zane Herald, MD  PRIMARY REASON FOR VISIT:  No diagnosis found.   HISTORY OF PRESENT ILLNESS:   Amanda Hawkins, a 50 y.o. female, was seen for a [place] cancer genetics consultation during the breast multidisciplinary clinic at the request of Dr. Lindi Adie due to a personal and family history of cancer.  Amanda Hawkins presents to clinic today to discuss the possibility of a hereditary predisposition to cancer, to discuss genetic testing, and to further clarify her future cancer risks, as well as potential cancer risks for family members.   In June 2022, at the age of 70, Amanda Hawkins was diagnosed with ductal carcinoma in situ of the left breast (ER+/PR+). The preliminary treatment plan includes lumpectomy, adjuvant radiation, and adjuvant anti-estrogens. ***   CANCER HISTORY:  Oncology History  Ductal carcinoma in situ (DCIS) of left breast  07/01/2020 Initial Diagnosis   Ductal carcinoma in situ (DCIS) of left breast  High-risk screening breast MRI on 06/03/20 showed indeterminate 0.6 cm oval enhancing mass in the central upper left breast and no evidence of malignancy in the right breast. 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 Diagnostic mammogram and [place] on 06/26/20 showed new biopsy clip within the retroareolar to slightly outer left breast and no evidence of malignancy in the right breast.      RISK FACTORS:  Menarche was at age 35.  First live birth at age 52.  OCP use for approximately 11 years.  Ovaries intact: yes.  Hysterectomy: no.  Menopausal status: premenopausal.  Colonoscopy: no; not examined. Mammogram within the last year: yes. Up to date with pelvic exams: yes; most recent PAP in Nov 2021  No past medical history on file.  Past Surgical History:  Procedure Laterality Date   BREAST  BIOPSY Left 06/23/2020   MRI   BREAST BIOPSY Right 2017    Social History   Socioeconomic History   Marital status: Single    Spouse name: Not on file   Number of children: Not on file   Years of education: Not on file   Highest education level: Not on file  Occupational History   Not on file  Tobacco Use   Smoking status: Never   Smokeless tobacco: Never  Substance and Sexual Activity   Alcohol use: Not on file   Drug use: Not on file   Sexual activity: Not on file  Other Topics Concern   Not on file  Social History Narrative   Not on file   Social Determinants of Health   Financial Resource Strain: Not on file  Food Insecurity: Not on file  Transportation Needs: Not on file  Physical Activity: Not on file  Stress: Not on file  Social Connections: Not on file     FAMILY HISTORY:  We obtained a detailed, 4-generation family history.  Significant diagnoses are listed below: Family History  Problem Relation Age of Onset   Breast cancer Mother 1    Ms. Dooly is {aware/unaware} of previous family history of genetic testing for hereditary cancer risks. Patient's maternal ancestors are of *** descent, and paternal ancestors are of *** descent. There {IS NO:12509} reported Ashkenazi Jewish ancestry. There {IS NO:12509} known consanguinity.  GENETIC COUNSELING ASSESSMENT: Amanda Hawkins is a 50 y.o. female with a personal and family history of cancer which is somewhat suggestive of  a hereditary cancer syndrome and predisposition to cancer given the presence of related cancers before the age of 75. We, therefore, discussed and recommended the following at today's visit.   DISCUSSION: We discussed that 5 - 10% of cancer is hereditary, with most cases of hereditary breast cancer associated with mutations in BRCA1/2.  There are other genes that can be associated with hereditary breast and *** cancer syndromes.  Type of cancer risk and level of risk are gene-specific. We discussed that  testing is beneficial for several reasons including knowing how to follow individuals after completing their treatment, identifying whether potential treatment options would be beneficial, and understanding if other family members could be at risk for cancer and allowing them to undergo genetic testing.   We reviewed the characteristics, features and inheritance patterns of hereditary cancer syndromes. We also discussed genetic testing, including the appropriate family members to test, the process of testing, insurance coverage and turn-around-time for results. We discussed the implications of a negative, positive and/or variant of uncertain significant result. In order to get genetic test results in a timely manner so that Amanda Hawkins can use these genetic test results for surgical decisions, we recommended Amanda Hawkins pursue genetic testing for the [place] BRCAPlus STAT Panel.  The BRCAplus panel offered by [employer] and includes sequencing and deletion/duplication analysis for the following 8 genes: ATM, BRCA1, BRCA2, CDH1, CHEK2, PALB2, PTEN, and TP53.  Once complete, we recommend Amanda Hawkins pursue reflex genetic testing to a more comprehensive gene panel.   Amanda Hawkins  was offered a common hereditary cancer panel (47 genes) and an expanded pan-cancer panel (77 genes). Amanda Hawkins was informed of the benefits and limitations of each panel, including that expanded pan-cancer panels contain genes that do not have clear management guidelines at this point in time.  We also discussed that as the number of genes included on a panel increases, the chances of variants of uncertain significance increases.  After considering the benefits and limitations of each gene panel, Amanda Hawkins  elected to have *** through ***.  ***  Based on Amanda Hawkins's personal and family history of cancer, she meets medical criteria for genetic testing. Despite that she meets criteria, she may still have an out of pocket cost. We discussed  that if her out of pocket cost for testing is over $100, the laboratory should contact her to discuss self-pay prices, patient pay assistance programs, if applicable, and other billing options.   PLAN: After considering the risks, benefits, and limitations, Ms. Kochanowski provided informed consent to pursue genetic testing and the blood sample was sent to {Lab} Laboratories for analysis of the {test}. Results should be available within approximately 1-2 weeks' time, at which point they will be disclosed by telephone to Amanda Hawkins, as will any additional recommendations warranted by these results. Amanda Hawkins will receive a summary of her genetic counseling visit and a copy of her results once available. This information will also be available in Epic.  Despite our recommendation, Amanda Hawkins did not wish to pursue genetic testing at today's visit. We understand this decision and remain available to coordinate genetic testing at any time in the future. We, therefore, recommend Amanda Hawkins continue to follow the cancer screening guidelines given by her primary healthcare provider. on Ms. Mau's family history, we recommended her ***, who was diagnosed with *** at age ***, have genetic counseling and testing. Amanda Hawkins will let us know if we can be of any assistance in  coordinating genetic counseling and/or testing for this family member.   Lastly, we encouraged Amanda Hawkins to remain in contact with cancer genetics annually so that we can continuously update the family history and inform her of any changes in cancer genetics and testing that may be of benefit for this family.   Amanda Hawkins's questions were answered to her satisfaction today. Our contact information was provided should additional questions or concerns arise. Thank you for the referral and allowing [place] to share in the care of your patient.   Sheridan Gettel M. Joette Catching, [place], [facility] Genetic Counselor [email or web address] (P) [phone]  The patient was seen for  a total of *** minutes in face-to-face ***audio and video genetic counseling.  ***The patient brought ***.  ***The patient was seen alone.  Drs. Magrinat, Lindi Adie and/or Burr Medico were available to discuss this case as needed.  _______________________________________________________________________ For Office Staff:  Number of people involved in session: *** Was an Intern/ student involved with case: {YES/NO:63}

## 2020-07-07 NOTE — Progress Notes (Signed)
REFERRING PROVIDER: Nicholas Lose, MD [place],  [place] [id number]  PRIMARY PROVIDER:  Zane Herald, MD  PRIMARY REASON FOR VISIT:  1. Ductal carcinoma in situ (DCIS) of left breast   2. Family history of breast cancer      HISTORY OF PRESENT ILLNESS:   Amanda Hawkins, a 50 y.o. female, was seen for a [facility] cancer genetics consultation at the request of Dr. Lindi Adie due to a personal and family history of breast cancer.  Amanda Hawkins presents to clinic today to discuss the possibility of a hereditary predisposition to cancer, genetic testing, and to further clarify her future cancer risks, as well as potential cancer risks for family members.   In June 2022, at the age of 38, Amanda Hawkins was diagnosed with DCIS of the left breast. The treatment plan is currently being determined.     CANCER HISTORY:  Oncology History  Ductal carcinoma in situ (DCIS) of left breast  06/23/2020 Initial Diagnosis   High-risk screening breast MRI on 06/03/20 showed indeterminate 0.6 cm oval enhancing mass in the central upper left breast and no evidence of malignancy in the right breast. 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    07/07/2020 Cancer Staging   Staging form: Breast, AJCC 8th Edition - Clinical stage from 07/07/2020: Stage 0 (cTis (DCIS), cN0, cM0, ER+, PR+) - Signed by Nicholas Lose, MD on 07/07/2020  Stage prefix: Initial diagnosis  Nuclear grade: G1       RISK FACTORS:  Menarche was at age 33.  First live birth at age 50.  OCP use for approximately 11 years.  Ovaries intact: yes.  Hysterectomy: no.  Menopausal status: premenopausal.  HRT use: 0 years. Colonoscopy: no; not examined. Mammogram within the last year: yes. Number of breast biopsies: 1. Up to date with pelvic exams: yes. Any excessive radiation exposure in the past: no  Past Medical History:  Diagnosis Date   Breast cancer ([facility])    Family  history of breast cancer     Past Surgical History:  Procedure Laterality Date   BREAST BIOPSY Left 06/23/2020   MRI   BREAST BIOPSY Right 2017   CESAREAN SECTION      Social History   Socioeconomic History   Marital status: Married    Spouse name: Not on file   Number of children: Not on file   Years of education: Not on file   Highest education level: Not on file  Occupational History   Not on file  Tobacco Use   Smoking status: Never   Smokeless tobacco: Never  Substance and Sexual Activity   Alcohol use: Yes    Comment: 10   Drug use: Never   Sexual activity: Not on file  Other Topics Concern   Not on file  Social History Narrative   Not on file   Social Determinants of Health   Financial Resource Strain: Not on file  Food Insecurity: Not on file  Transportation Needs: Not on file  Physical Activity: Not on file  Stress: Not on file  Social Connections: Not on file     FAMILY HISTORY:  We obtained a detailed, 4-generation family history.  Significant diagnoses are listed below: Family History  Problem Relation Age of Onset   Breast cancer Mother 53       second dx at 65   Leukemia Mother    COPD Maternal Grandmother    Cancer  Paternal Grandmother    Tuberculosis Paternal Grandfather     The patient has four children, two of which are adopted.  None have cancer.  She has two sisters, both are cancer free.  Both parents are living.  The patient's mother had breast cancer at 29 and again at 100, and then leukemia.  She has two brothers who are cancer free.  Her parents are both deceased.  The patient's father is living.  He grew up in foster care so there is not a lot of information on the family.  He had a brother who is deceased and he had a maternal half sister who is deceased.  His mother may have had cancer, his father died of TB.  Ms. Oland is unaware of previous family history of genetic testing for hereditary cancer risks. Patient's maternal  ancestors are of [place] and [place] descent, and paternal ancestors are of Caucasian descent. There is no reported Ashkenazi Jewish ancestry. There is no known consanguinity.  GENETIC COUNSELING ASSESSMENT: Amanda Hawkins is a 50 y.o. female with a personal and family history of breast cancer which is somewhat suggestive of a hereditary cancer syndrome and predisposition to cancer given her young age of onset. We, therefore, discussed and recommended the following at today's visit.   DISCUSSION: We discussed that 5 - 10% of breast cancer is hereditary, with most cases associated with BRCA mutations.  There are other genes that can be associated with hereditary breast cancer syndromes.  These include ATM, CHEK2 and PALB2.  We discussed that testing is beneficial for several reasons including knowing how to follow individuals after completing their treatment, identifying whether potential treatment options such as PARP inhibitors would be beneficial, and understand if other family members could be at risk for cancer and allow them to undergo genetic testing.   We reviewed the characteristics, features and inheritance patterns of hereditary cancer syndromes. We also discussed genetic testing, including the appropriate family members to test, the process of testing, insurance coverage and turn-around-time for results. We discussed the implications of a negative, positive and/or variant of uncertain significant result. In order to get genetic test results in a timely manner so that Amanda Hawkins can use these genetic test results for surgical decisions, we recommended Amanda Hawkins pursue genetic testing for the BRCAPlus Stat panel. Once complete, we recommend Amanda Hawkins pursue reflex genetic testing to the CancerNext-Expanded+RNAinsight gene panel. The CancerNext-Expanded gene panel offered by [facility] and includes sequencing and rearrangement analysis for the following 77 genes: AIP, ALK, APC*, ATM*, AXIN2, BAP1, BARD1,  BLM, BMPR1A, BRCA1*, BRCA2*, BRIP1*, CDC73, CDH1*, CDK4, CDKN1B, CDKN2A, CHEK2*, CTNNA1, DICER1, FANCC, FH, FLCN, GALNT12, KIF1B, LZTR1, MAX, MEN1, MET, MLH1*, MSH2*, MSH3, MSH6*, MUTYH*, NBN, NF1*, NF2, NTHL1, PALB2*, PHOX2B, PMS2*, POT1, PRKAR1A, PTCH1, PTEN*, RAD51C*, RAD51D*, RB1, RECQL, RET, SDHA, SDHAF2, SDHB, SDHC, SDHD, SMAD4, SMARCA4, SMARCB1, SMARCE1, STK11, SUFU, TMEM127, TP53*, TSC1, TSC2, VHL and XRCC2 (sequencing and deletion/duplication); EGFR, EGLN1, HOXB13, KIT, MITF, PDGFRA, POLD1, and POLE (sequencing only); EPCAM and GREM1 (deletion/duplication only). DNA and RNA analyses performed for * genes.   Based on Amanda Hawkins's personal and family history of cancer, she meets medical criteria for genetic testing. Despite that she meets criteria, she may still have an out of pocket cost.   PLAN: After considering the risks, benefits, and limitations, Amanda Hawkins provided informed consent to pursue genetic testing and the blood sample was sent to [facility] for analysis of the CancerNext-Expanded+RNAinsight. Results should be available within  approximately 2-3 weeks' time, at which point they will be disclosed by telephone to Amanda Hawkins, as will any additional recommendations warranted by these results. Amanda Hawkins will receive a summary of her genetic counseling visit and a copy of her results once available. This information will also be available in Epic.   Lastly, we encouraged Amanda Hawkins to remain in contact with cancer genetics annually so that we can continuously update the family history and inform her of any changes in cancer genetics and testing that may be of benefit for this family.   Amanda Hawkins's questions were answered to her satisfaction today. Our contact information was provided should additional questions or concerns arise. Thank you for the referral and allowing [place] to share in the care of your patient.   Ryann Leavitt P. Florene Glen, [place], [facility] Licensed, Mudlogger [email or web address] phone: [phone]  The patient was seen for a total of 20 minutes in face-to-face genetic counseling.  The patient brought her husband. This patient was discussed with Drs. Magrinat, Lindi Adie and/or Burr Medico who agrees with the above.    _______________________________________________________________________ For Office Staff:  Number of people involved in session: 2 Was an Intern/ student involved with case: no

## 2020-07-07 NOTE — Therapy (Signed)
[place], [place], [address] Phone: [phone]   Fax:  [phone]  Physical Therapy Evaluation  Patient Details  Name: Amanda Hawkins MRN: [id number] Date of Birth: [birth year] Referring Provider (PT): Dr. Rolm Bookbinder   Encounter Date: 07/07/2020   PT End of Session - 07/07/20 1203     Visit Number 1    Number of Visits 2    Date for PT Re-Evaluation 09/01/20    PT Start Time 0950    PT Stop Time 1005   Also saw pt from 1030-1055 for a total of 40 min   PT Time Calculation (min) 15 min    Activity Tolerance Patient tolerated treatment well    Behavior During Therapy [facility] for tasks assessed/performed             Past Medical History:  Diagnosis Date   Breast cancer [facility])     Past Surgical History:  Procedure Laterality Date   BREAST BIOPSY Left 06/23/2020   MRI   BREAST BIOPSY Right 2017   CESAREAN SECTION      There were no vitals filed for this visit.    Subjective Assessment - 07/07/20 1144     Subjective Patient reports she is here today to be seen by her medical team for her newly diagnosed left breast cancer.    Pertinent History Patient was diagnosed on 06/02/2020 with left low grade DCIS breast cancer. It measures 6 mm and is located in the upper outer quadrant. It is ER/PR positive.    Patient Stated Goals Reduce lymphedema risk and learn post op HEP    Currently in Pain? No/denies                [facility] PT Assessment - 07/07/20 0001       Assessment   Medical Diagnosis Left breast cancer    Referring Provider (PT) Dr. Rolm Bookbinder    Onset Date/Surgical Date 06/02/20    Hand Dominance Left    Prior Therapy none      Precautions   Precautions Other (comment)    Precaution Comments active cancer      Restrictions   Weight Bearing Restrictions No      Balance Screen   Has the patient fallen in the past 6 months No    Has the patient had a decrease  in activity level because of a fear of falling?  No    Is the patient reluctant to leave their home because of a fear of falling?  No      Home Environment   Living Environment Private residence    Living Arrangements Spouse/significant other;Children   Husband and 4 kids ages 13-20   Available Help at Discharge Family      Prior Function   Level of Independence Independent    Vocation Part time employment    Biochemist, clinical    Leisure She does yoga and walks 3x/week for 30-60 min      Cognition   Overall Cognitive Status Within Functional Limits for tasks assessed      Posture/Postural Control   Posture/Postural Control No significant limitations      ROM / Strength   AROM / PROM / Strength AROM;Strength      AROM   Overall AROM Comments Cervical AROM is WNL    AROM Assessment Site Shoulder    Right/Left Shoulder Right;Left    Right Shoulder Extension 43 Degrees  Right Shoulder Flexion 155 Degrees    Right Shoulder ABduction 155 Degrees    Right Shoulder Internal Rotation 65 Degrees    Right Shoulder External Rotation 90 Degrees    Left Shoulder Extension 50 Degrees    Left Shoulder Flexion 148 Degrees    Left Shoulder ABduction 152 Degrees    Left Shoulder Internal Rotation 55 Degrees    Left Shoulder External Rotation 85 Degrees      Strength   Overall Strength Within functional limits for tasks performed               LYMPHEDEMA/ONCOLOGY QUESTIONNAIRE - 07/07/20 0001       Type   Cancer Type Left breast cancer      Lymphedema Assessments   Lymphedema Assessments Upper extremities      Right Upper Extremity Lymphedema   10 cm Proximal to Olecranon Process 26.8 cm    Olecranon Process 23.6 cm    10 cm Proximal to Ulnar Styloid Process 21 cm    Just Proximal to Ulnar Styloid Process 15.5 cm    Across Hand at [employer] 18.7 cm    At [place] of 2nd Digit 6.5 cm      Left Upper Extremity Lymphedema   10 cm Proximal to Olecranon  Process 26.5 cm    Olecranon Process 23.5 cm    10 cm Proximal to Ulnar Styloid Process 20.6 cm    Just Proximal to Ulnar Styloid Process 15.5 cm    Across Hand at [employer] 18.8 cm    At [place] of 2nd Digit 6.2 cm             L-DEX FLOWSHEETS - 07/07/20 1200       L-DEX LYMPHEDEMA SCREENING   Measurement Type Unilateral    L-DEX MEASUREMENT EXTREMITY Upper Extremity    POSITION  Standing    DOMINANT SIDE Left    At Risk Side Left    BASELINE SCORE (UNILATERAL) -7.5             The patient was assessed using the L-Dex machine today to produce a lymphedema index baseline score. The patient will be reassessed on a regular basis (typically every 3 months) to obtain new L-Dex scores. If the score is > 6.5 points away from his/her baseline score indicating onset of subclinical lymphedema, it will be recommended to wear a compression garment for 4 weeks, 12 hours per day and then be reassessed. If the score continues to be > 6.5 points from baseline at reassessment, we will initiate lymphedema treatment. Assessing in this manner has a 95% rate of preventing clinically significant lymphedema.      Katina Dung - 07/07/20 0001     Open a tight or new jar No difficulty    Do heavy household chores (wash walls, wash floors) No difficulty    Carry a shopping bag or briefcase No difficulty    Wash your back Mild difficulty    Use a knife to cut food No difficulty    Recreational activities in which you take some force or impact through your arm, shoulder, or hand (golf, hammering, tennis) No difficulty    During the past week, to what extent has your arm, shoulder or hand problem interfered with your normal social activities with family, friends, neighbors, or groups? Not at all    During the past week, to what extent has your arm, shoulder or hand problem limited your work or other regular daily  activities Not at all    Arm, shoulder, or hand pain. None    Tingling (pins and  needles) in your arm, shoulder, or hand None    Difficulty Sleeping No difficulty    DASH Score 2.27 %              Objective measurements completed on examination: See above findings.       Patient was instructed today in a home exercise program today for post op shoulder range of motion. These included active assist shoulder flexion in sitting, scapular retraction, wall walking with shoulder abduction, and hands behind head external rotation.  She was encouraged to do these twice a day, holding 3 seconds and repeating 5 times when permitted by her physician.          PT Education - 07/07/20 1202     Education Details Lymphedema education and post op shoulder HEP    Person(s) Educated Patient;Spouse    Methods Explanation;Demonstration;Handout    Comprehension Returned demonstration;Verbalized understanding                 PT Long Term Goals - 07/07/20 1229       PT LONG TERM GOAL #1   Title Patient will demonstrate she has regained full shoulder ROM and function post operatively compared to baselines.    Time 8    Period Weeks    Status New    Target Date 09/01/20             Breast Clinic Goals - 07/07/20 1228       Patient will be able to verbalize understanding of pertinent lymphedema risk reduction practices relevant to her diagnosis specifically related to skin care.   Time 1    Period Days    Status Achieved      Patient will be able to return demonstrate and/or verbalize understanding of the post-op home exercise program related to regaining shoulder range of motion.   Time 1    Period Days    Status Achieved      Patient will be able to verbalize understanding of the importance of attending the postoperative After Breast Cancer Class for further lymphedema risk reduction education and therapeutic exercise.   Time 1    Period Days    Status Achieved                   Plan - 07/07/20 1203     Clinical Impression Statement  Patient was diagnosed on 06/02/2020 with left low grade DCIS breast cancer. It measures 6 mm and is located in the upper outer quadrant. It is ER/PR positive. Her multidisciplinary medical team met prior to her assessments to determine a recommended treatment plan. She is undecided about her surgery plan but is considering a bilateral mastectomy with reconstruction with a left sentinel node biopsy followed by anti-estrogen therapy. She may consider a left lumpectomy and radiation instead of a bilateral mastectomy. She will benefit from a post op PT reassessment to determine needs and from L-Dex screens every 3 months to detect subclinical lymphedema if she undergoes a sentinel node biopsy.    Stability/Clinical Decision Making Stable/Uncomplicated    Clinical Decision Making Low    Rehab Potential Excellent    PT Frequency --   Eval and 1 f/u visit   PT Treatment/Interventions ADLs/Self Care Home Management;Therapeutic exercise;Patient/family education    PT Next Visit Plan Will reassess4 weeks post op to determine needs    PT Home  Exercise Plan Post op shoulder ROM HEP    Consulted and Agree with Plan of Care Patient;Family member/caregiver    Family Member Consulted husband             Patient will benefit from skilled therapeutic intervention in order to improve the following deficits and impairments:  Postural dysfunction, Decreased range of motion, Decreased knowledge of precautions, Impaired UE functional use, Pain  Visit Diagnosis: Ductal carcinoma in situ (DCIS) of left breast - Plan: PT plan of care cert/re-cert  Patient will follow up at outpatient cancer rehab 3-4 weeks following surgery.  If the patient requires physical therapy at that time, a specific plan will be dictated and sent to the referring physician for approval. The patient was educated today on appropriate basic range of motion exercises to begin post operatively and the importance of attending the After Breast Cancer  class following surgery.  Patient was educated today on lymphedema risk reduction practices as it pertains to recommendations that will benefit the patient immediately following surgery.  She verbalized good understanding.      Problem List Patient Active Problem List   Diagnosis Date Noted   Ductal carcinoma in situ (DCIS) of left breast 07/01/2020   Encounter for counseling 11/30/2017   Annia Friendly, PT 07/07/20 12:32 PM   [place], [place], [address] Phone: [phone]   Fax:  [phone]  Name: Eletha Culbertson MRN: [id number] Date of Birth: [birth year]

## 2020-07-07 NOTE — Assessment & Plan Note (Addendum)
06/23/2020: High risk screening breast MRI revealed indeterminate 0.6 cm oval mass in the left breast.  Biopsy revealed low-grade DCIS ER/PR 100% positive.  Pathology review: I discussed with the patient the difference between DCIS and invasive breast cancer. It is considered a precancerous lesion. DCIS is classified as a 0. It is generally detected through mammograms as calcifications. We discussed the significance of grades and its impact on prognosis. We also discussed the importance of ER and PR receptors and their implications to adjuvant treatment options. Prognosis of DCIS dependence on grade, comedo necrosis. It is anticipated that if not treated, 20-30% of DCIS can develop into invasive breast cancer.  Recommendation: 1. Breast conserving surgery versus bilateral mastectomies and reconstruction 2. Followed by adjuvant radiation therapy 3. Followed by antiestrogen therapy with tamoxifen 5 years (if she has bilateral mastectomies she will not need radiation or antiestrogen therapy)  Tamoxifen counseling: We discussed the risks and benefits of tamoxifen. These include but not limited to insomnia, hot flashes, mood changes, vaginal dryness, and weight gain. Although rare, serious side effects including endometrial cancer, risk of blood clots were also discussed. We strongly believe that the benefits far outweigh the risks. Patient understands these risks and consented to starting treatment. Planned treatment duration is 5 years.  Return to clinic after surgery to discuss the final pathology report and come up with an adjuvant treatment plan.

## 2020-07-07 NOTE — Patient Instructions (Signed)

## 2020-07-08 ENCOUNTER — Telehealth: Payer: Self-pay | Admitting: *Deleted

## 2020-07-08 ENCOUNTER — Encounter: Payer: Self-pay | Admitting: *Deleted

## 2020-07-08 NOTE — Telephone Encounter (Signed)
Spoke to pt concerning [place] from 6.22.22. Denies questions or concerns regarding dx or treatment care plan.  Pt scheduled to see Dr. Iran Planas on 6/24 to discuss sx options with reconstruction. Pt will inform nurse navigator of sx decision Encourage pt to call with needs. Received verbal understanding.

## 2020-07-09 ENCOUNTER — Ambulatory Visit: Payer: Self-pay | Admitting: Surgery

## 2020-07-09 DIAGNOSIS — Z9189 Other specified personal risk factors, not elsewhere classified: Secondary | ICD-10-CM

## 2020-07-09 DIAGNOSIS — D0512 Intraductal carcinoma in situ of left breast: Secondary | ICD-10-CM

## 2020-07-12 ENCOUNTER — Encounter: Payer: Self-pay | Admitting: *Deleted

## 2020-07-14 ENCOUNTER — Encounter: Payer: Self-pay | Admitting: Genetic Counselor

## 2020-07-14 ENCOUNTER — Telehealth: Payer: Self-pay | Admitting: Genetic Counselor

## 2020-07-14 DIAGNOSIS — Z1379 Encounter for other screening for genetic and chromosomal anomalies: Secondary | ICD-10-CM | POA: Insufficient documentation

## 2020-07-14 NOTE — Telephone Encounter (Signed)
Initial STAT panel results revealed as negative.  The BRCAPlus gene panel offered by [facility] and includes sequencing and rearrangement analysis for the following 8 genes: IATM, BRCA1, BRCA2, CDH1, CHEK2, PALB2, PTEN, and TP53. We will call when the remainder of test results are completed.

## 2020-07-20 ENCOUNTER — Telehealth: Payer: Self-pay | Admitting: Genetic Counselor

## 2020-07-20 NOTE — Telephone Encounter (Signed)
LM on VM that results are back and to please call. 

## 2020-07-22 ENCOUNTER — Ambulatory Visit: Payer: Self-pay | Admitting: Genetic Counselor

## 2020-07-22 DIAGNOSIS — Z1379 Encounter for other screening for genetic and chromosomal anomalies: Secondary | ICD-10-CM

## 2020-07-22 NOTE — Progress Notes (Signed)
HPI:  Amanda Hawkins was previously seen in the [place] clinic due to a personal and family history of breast cancer and concerns regarding a hereditary predisposition to cancer. Please refer to our prior cancer genetics clinic note for more information regarding our discussion, assessment and recommendations, at the time. Amanda Hawkins's recent genetic test results were disclosed to her, as were recommendations warranted by these results. These results and recommendations are discussed in more detail below.  CANCER HISTORY:  Oncology History  Ductal carcinoma in situ (DCIS) of left breast  06/23/2020 Initial Diagnosis   High-risk screening breast MRI on 06/03/20 showed indeterminate 0.6 cm oval enhancing mass in the central upper left breast and no evidence of malignancy in the right breast. 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    07/07/2020 Cancer Staging   Staging form: Breast, AJCC 8th Edition - Clinical stage from 07/07/2020: Stage 0 (cTis (DCIS), cN0, cM0, ER+, PR+) - Signed by Nicholas Lose, MD on 07/07/2020  Stage prefix: Initial diagnosis  Nuclear grade: G1    07/18/2020 Genetic Testing   Negative genetic testing on the CancerNext-Expanded+RNAinsight panel testing.  The report date is July 18, 2020.  The CancerNext-Expanded gene panel offered by [facility] and includes sequencing and rearrangement analysis for the following 77 genes: AIP, ALK, APC*, ATM*, AXIN2, BAP1, BARD1, BLM, BMPR1A, BRCA1*, BRCA2*, BRIP1*, CDC73, CDH1*, CDK4, CDKN1B, CDKN2A, CHEK2*, CTNNA1, DICER1, FANCC, FH, FLCN, GALNT12, KIF1B, LZTR1, MAX, MEN1, MET, MLH1*, MSH2*, MSH3, MSH6*, MUTYH*, NBN, NF1*, NF2, NTHL1, PALB2*, PHOX2B, PMS2*, POT1, PRKAR1A, PTCH1, PTEN*, RAD51C*, RAD51D*, RB1, RECQL, RET, SDHA, SDHAF2, SDHB, SDHC, SDHD, SMAD4, SMARCA4, SMARCB1, SMARCE1, STK11, SUFU, TMEM127, TP53*, TSC1, TSC2, VHL and XRCC2 (sequencing and deletion/duplication);  EGFR, EGLN1, HOXB13, KIT, MITF, PDGFRA, POLD1, and POLE (sequencing only); EPCAM and GREM1 (deletion/duplication only). DNA and RNA analyses performed for * genes.      FAMILY HISTORY:  We obtained a detailed, 4-generation family history.  Significant diagnoses are listed below: Family History  Problem Relation Age of Onset   Breast cancer Mother 68       second dx at 43   Leukemia Mother    COPD Maternal Grandmother    Cancer Paternal Grandmother    Tuberculosis Paternal Grandfather     The patient has four children, two of which are adopted.  None have cancer.  She has two sisters, both are cancer free.  Both parents are living.   The patient's mother had breast cancer at 44 and again at 13, and then leukemia.  She has two brothers who are cancer free.  Her parents are both deceased.   The patient's father is living.  He grew up in foster care so there is not a lot of information on the family.  He had a brother who is deceased and he had a maternal half sister who is deceased.  His mother may have had cancer, his father died of TB.   Amanda Hawkins is unaware of previous family history of genetic testing for hereditary cancer risks. Patient's maternal ancestors are of [place] and [place] descent, and paternal ancestors are of Caucasian descent. There is no reported Ashkenazi Jewish ancestry. There is no known consanguinity.  GENETIC TEST RESULTS: Genetic testing reported out on July 18, 2020 through the CancerNext-Expanded+RNAinsight cancer panel found no pathogenic mutations. The CancerNext-Expanded gene panel offered by [facility] and includes sequencing and rearrangement analysis for the following 77 genes: AIP,  ALK, APC*, ATM*, AXIN2, BAP1, BARD1, BLM, BMPR1A, BRCA1*, BRCA2*, BRIP1*, CDC73, CDH1*, CDK4, CDKN1B, CDKN2A, CHEK2*, CTNNA1, DICER1, FANCC, FH, FLCN, GALNT12, KIF1B, LZTR1, MAX, MEN1, MET, MLH1*, MSH2*, MSH3, MSH6*, MUTYH*, NBN, NF1*, NF2, NTHL1, PALB2*, PHOX2B, PMS2*, POT1,  PRKAR1A, PTCH1, PTEN*, RAD51C*, RAD51D*, RB1, RECQL, RET, SDHA, SDHAF2, SDHB, SDHC, SDHD, SMAD4, SMARCA4, SMARCB1, SMARCE1, STK11, SUFU, TMEM127, TP53*, TSC1, TSC2, VHL and XRCC2 (sequencing and deletion/duplication); EGFR, EGLN1, HOXB13, KIT, MITF, PDGFRA, POLD1, and POLE (sequencing only); EPCAM and GREM1 (deletion/duplication only). DNA and RNA analyses performed for * genes. The test report has been scanned into EPIC and is located under the Molecular Pathology section of the Results Review tab.  A portion of the result report is included below for reference.     We discussed with Amanda Hawkins that because current genetic testing is not perfect, it is possible there may be a gene mutation in one of these genes that current testing cannot detect, but that chance is small.  We also discussed, that there could be another gene that has not yet been discovered, or that we have not yet tested, that is responsible for the cancer diagnoses in the family. It is also possible there is a hereditary cause for the cancer in the family that Amanda Hawkins did not inherit and therefore was not identified in her testing.  Therefore, it is important to remain in touch with cancer genetics in the future so that we can continue to offer Amanda Hawkins the most up to date genetic testing.   ADDITIONAL GENETIC TESTING: We discussed with Amanda Hawkins that her genetic testing was fairly extensive.  If there are genes identified to increase cancer risk that can be analyzed in the future, we would be happy to discuss and coordinate this testing at that time.    CANCER SCREENING RECOMMENDATIONS: Amanda Hawkins's test result is considered negative (normal).  This means that we have not identified a hereditary cause for her personal and family history of breast cancer at this time. Most cancers happen by chance and this negative test suggests that her cancer may fall into this category.    While reassuring, this does not definitively rule out a  hereditary predisposition to cancer. It is still possible that there could be genetic mutations that are undetectable by current technology. There could be genetic mutations in genes that have not been tested or identified to increase cancer risk.  Therefore, it is recommended she continue to follow the cancer management and screening guidelines provided by her oncology and primary healthcare provider.   An individual's cancer risk and medical management are not determined by genetic test results alone. Overall cancer risk assessment incorporates additional factors, including personal medical history, family history, and any available genetic information that may result in a personalized plan for cancer prevention and surveillance  RECOMMENDATIONS FOR FAMILY MEMBERS:  Individuals in this family might be at some increased risk of developing cancer, over the general population risk, simply due to the family history of cancer.  We recommended women in this family have a yearly mammogram beginning at age 2, or 74 years younger than the earliest onset of cancer, an annual clinical breast exam, and perform monthly breast self-exams. Women in this family should also have a gynecological exam as recommended by their primary provider. All family members should be referred for colonoscopy starting at age 73.  FOLLOW-UP: Lastly, we discussed with Amanda Hawkins that cancer genetics is a rapidly advancing field and it is possible  that new genetic tests will be appropriate for her and/or her family members in the future. We encouraged her to remain in contact with cancer genetics on an annual basis so we can update her personal and family histories and let her know of advances in cancer genetics that may benefit this family.   Our contact number was provided. Amanda Hawkins's questions were answered to her satisfaction, and she knows she is welcome to call us at anytime with additional questions or concerns.   Roma Kayser, [place],  [facility] Licensed, Certified Genetic Counselor [email or web address]

## 2020-07-22 NOTE — Telephone Encounter (Signed)
LM on VM that results are back and to please call.  Call #2

## 2020-07-22 NOTE — Telephone Encounter (Signed)
Revealed negative genetic testing.  Discussed that we do not know why she has breast cancer or why there is cancer in the family. It could be due to a different gene that we are not testing, or maybe our current technology may not be able to pick something up.  It will be important for her to keep in contact with genetics to keep up with whether additional testing may be needed. 

## 2020-08-11 NOTE — H&P (Signed)
Subjective:     Patient ID: Amanda Hawkins is a 50 y.o. female.   HPI   Here for follow up discussion breast reconstruction prior to bilateral mastectomies. Presented following high risk screening MRI showing indeterminate 0.6 cm oval mass in the central upper left breast. Biopsy showed low grade DCIS, ER/PR+.    Mother with breast ca and leukemia history. Patient previously tested negative for BRCA. Updated genetic testing 2022 (CancerNext-Expanded+RNAinsight panel) negative. Mother had from description TRAM based reconstruction at [facility]. Had mesh used in abdomen.    Current 32 A. Does not desire significant change, "full B". Wt stable.   Lives with spouse and kids aged 13-21. 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   Review of Systems Remainder 12 point review negative    Objective:   Physical Exam Cardiovascular:     Rate and Rhythm: Normal rate and regular rhythm.     Heart sounds: Normal heart sounds.  Pulmonary:     Effort: Pulmonary effort is normal.     Breath sounds: Normal breath sounds.  Chest:  Breasts:     Right: No axillary adenopathy.     Left: No axillary adenopathy.      Abdominal:     Comments: Minimal soft tissue for reconstruction  Lymphadenopathy:     Upper Body:     Right upper body: No axillary adenopathy.     Left upper body: No axillary adenopathy.  Skin:    Comments: Fitzpatrick 2     Breasts: Pseudoptosis bilateral no palpable masses Sn to nipple R 20 L 20  cm BW R 15 L 15 cm CW 12 cm Nipple to IMF R 5 L 5 cm    Assessment:     DCIS left Family history breast ca    Plan:     Plan bilateral NSM with immediate reconstruction.   Reviewed DTI vs immediate expander placement. She is candidate for DTI if does not desire significant change in size. Reviewed still common for additional surgery in setting of DTI including for fat grafting. Plan for immediate tissue expander placement. Reviewed timing of surgery for permanent  implant placement.   Reviewed reconstruction will be asensate and not stimulate. Reviewed risks mastectomy flap necrosis requiring additional surgery.   Discussed use of acellular dermis in reconstruction, cadaveric source, incorporation over several weeks, risk that if has seroma or infection can act as additional nidus for infection if not incorporated. Reviewed this is an off label use of acellular dermis.    Reviewed prepectoral vs sub pectoral reconstruction. Discussed with patient and benefit of this is no animation deformity, may be less pain. Risk may be more visible rippling over upper poles, greater need of ADM. Reviewed pre pectoral would require larger amount acellular dermis, more drains. Discussed any type reconstruction also risks long term displacement implant and visible rippling. If prepectoral counseled I would recommend she be comfortable with silicone implants as more options that have less rippling. She agrees to prepectoral placement.   Additional risks including but not limited to bleeding, seroma, hematoma, damage to adjacent structures, need for additional procedures, blood clots in legs or lungs reviewed.   Drain teaching completed. Rx for oxycodone robaxin and Bactrim given. Rx for [place] given.

## 2020-08-17 ENCOUNTER — Encounter (HOSPITAL_BASED_OUTPATIENT_CLINIC_OR_DEPARTMENT_OTHER): Payer: Self-pay | Admitting: Surgery

## 2020-08-17 ENCOUNTER — Other Ambulatory Visit: Payer: Self-pay

## 2020-08-23 NOTE — Anesthesia Preprocedure Evaluation (Addendum)
Anesthesia Evaluation  Patient identified by MRN, date of birth, ID band Patient awake    Reviewed: Allergy & Precautions, NPO status , Patient's Chart, lab work & pertinent test results  History of Anesthesia Complications Negative for: history of anesthetic complications  Airway Mallampati: II  TM Distance: >3 FB Neck ROM: Full    Dental no notable dental hx. (+) Dental Advisory Given   Pulmonary neg pulmonary ROS,    Pulmonary exam normal        Cardiovascular negative cardio ROS Normal cardiovascular exam     Neuro/Psych negative neurological ROS     GI/Hepatic negative GI ROS, Neg liver ROS,   Endo/Other  Hypothyroidism   Renal/GU negative Renal ROS     Musculoskeletal negative musculoskeletal ROS (+)   Abdominal   Peds  Hematology negative hematology ROS (+)   Anesthesia Other Findings   Reproductive/Obstetrics                            Anesthesia Physical Anesthesia Plan  ASA: 2  Anesthesia Plan: General   Post-op Pain Management:  Regional for Post-op pain   Induction: Intravenous  PONV Risk Score and Plan: 4 or greater and Ondansetron, Dexamethasone, Midazolam and Scopolamine patch - Pre-op  Airway Management Planned: Oral ETT  Additional Equipment:   Intra-op Plan:   Post-operative Plan: Extubation in OR  Informed Consent:   Plan Discussed with: Anesthesiologist  Anesthesia Plan Comments:         Anesthesia Quick Evaluation

## 2020-08-24 ENCOUNTER — Ambulatory Visit (HOSPITAL_BASED_OUTPATIENT_CLINIC_OR_DEPARTMENT_OTHER): Payer: No Typology Code available for payment source | Admitting: Certified Registered Nurse Anesthetist

## 2020-08-24 ENCOUNTER — Encounter (HOSPITAL_COMMUNITY)
Admission: RE | Admit: 2020-08-24 | Discharge: 2020-08-24 | Disposition: A | Discharge status: Home / Self Care | Payer: No Typology Code available for payment source | Source: Ambulatory Visit | Attending: Surgery | Admitting: Surgery

## 2020-08-24 ENCOUNTER — Ambulatory Visit (HOSPITAL_BASED_OUTPATIENT_CLINIC_OR_DEPARTMENT_OTHER)
Admission: RE | Admit: 2020-08-24 | Discharge: 2020-08-25 | Disposition: A | Discharge status: Home / Self Care | Payer: No Typology Code available for payment source | Attending: Plastic Surgery | Admitting: Plastic Surgery

## 2020-08-24 ENCOUNTER — Other Ambulatory Visit: Payer: Self-pay

## 2020-08-24 ENCOUNTER — Encounter (HOSPITAL_BASED_OUTPATIENT_CLINIC_OR_DEPARTMENT_OTHER)
Admission: RE | Disposition: A | Discharge status: Home / Self Care | Payer: Self-pay | Source: Home / Self Care | Attending: Plastic Surgery

## 2020-08-24 ENCOUNTER — Encounter (HOSPITAL_BASED_OUTPATIENT_CLINIC_OR_DEPARTMENT_OTHER): Payer: Self-pay | Admitting: Surgery

## 2020-08-24 DIAGNOSIS — N6489 Other specified disorders of breast: Secondary | ICD-10-CM | POA: Insufficient documentation

## 2020-08-24 DIAGNOSIS — D0512 Intraductal carcinoma in situ of left breast: Secondary | ICD-10-CM

## 2020-08-24 DIAGNOSIS — Z170 Estrogen receptor positive status [ER+]: Secondary | ICD-10-CM | POA: Diagnosis not present

## 2020-08-24 DIAGNOSIS — Z803 Family history of malignant neoplasm of breast: Secondary | ICD-10-CM | POA: Diagnosis not present

## 2020-08-24 DIAGNOSIS — Z79890 Hormone replacement therapy (postmenopausal): Secondary | ICD-10-CM | POA: Insufficient documentation

## 2020-08-24 DIAGNOSIS — Z79899 Other long term (current) drug therapy: Secondary | ICD-10-CM | POA: Diagnosis not present

## 2020-08-24 HISTORY — PX: NIPPLE SPARING MASTECTOMY: SHX6537

## 2020-08-24 HISTORY — PX: BREAST RECONSTRUCTION WITH PLACEMENT OF TISSUE EXPANDER AND ALLODERM: SHX6805

## 2020-08-24 HISTORY — DX: Hypothyroidism, unspecified: E03.9

## 2020-08-24 HISTORY — PX: NIPPLE SPARING MASTECTOMY WITH SENTINEL LYMPH NODE BIOPSY: SHX6826

## 2020-08-24 LAB — POCT PREGNANCY, URINE: Preg Test, Ur: NEGATIVE

## 2020-08-24 SURGERY — NIPPLE SPARING MASTECTOMY WITH SENTINEL LYMPH NODE BIOPSY
Anesthesia: General | Site: Breast | Laterality: Right

## 2020-08-24 MED ORDER — PROPOFOL 500 MG/50ML IV EMUL
INTRAVENOUS | Status: AC
  Filled 2020-08-24: qty 50

## 2020-08-24 MED ORDER — ONDANSETRON HCL 4 MG/2ML IJ SOLN: 4.0000 mg | Freq: Four times a day (QID) | INTRAMUSCULAR | Status: DC | PRN

## 2020-08-24 MED ORDER — FENTANYL CITRATE (PF) 100 MCG/2ML IJ SOLN
100.0000 ug | Freq: Once | INTRAMUSCULAR | Status: AC
Start: 2020-08-24 — End: 2020-08-24
  Administered 2020-08-24: 100 ug via INTRAVENOUS

## 2020-08-24 MED ORDER — OXYCODONE HCL 5 MG PO TABS
5.0000 mg | ORAL_TABLET | ORAL | Status: DC | PRN
  Administered 2020-08-24 – 2020-08-25 (×3): 5 mg via ORAL
  Administered 2020-08-25: 10 mg via ORAL
  Filled 2020-08-24 (×2): qty 1
  Filled 2020-08-24: qty 2
  Filled 2020-08-24: qty 1

## 2020-08-24 MED ORDER — METHOCARBAMOL 500 MG PO TABS
500.0000 mg | ORAL_TABLET | Freq: Four times a day (QID) | ORAL | Status: DC | PRN
  Administered 2020-08-24 – 2020-08-25 (×2): 500 mg via ORAL
  Filled 2020-08-24 (×2): qty 1

## 2020-08-24 MED ORDER — PROPOFOL 500 MG/50ML IV EMUL
INTRAVENOUS | Status: DC | PRN
  Administered 2020-08-24: 25 ug/kg/min via INTRAVENOUS

## 2020-08-24 MED ORDER — LIDOCAINE HCL (PF) 2 % IJ SOLN
INTRAMUSCULAR | Status: AC
  Filled 2020-08-24: qty 5

## 2020-08-24 MED ORDER — FENTANYL CITRATE (PF) 100 MCG/2ML IJ SOLN
INTRAMUSCULAR | Status: AC
  Filled 2020-08-24: qty 2

## 2020-08-24 MED ORDER — FENTANYL CITRATE (PF) 100 MCG/2ML IJ SOLN
INTRAMUSCULAR | Status: DC | PRN
  Administered 2020-08-24 (×3): 50 ug via INTRAVENOUS

## 2020-08-24 MED ORDER — PHENYLEPHRINE 40 MCG/ML (10ML) SYRINGE FOR IV PUSH (FOR BLOOD PRESSURE SUPPORT)
PREFILLED_SYRINGE | INTRAVENOUS | Status: DC | PRN
  Administered 2020-08-24 (×3): 80 ug via INTRAVENOUS

## 2020-08-24 MED ORDER — PHENYLEPHRINE HCL (PRESSORS) 10 MG/ML IV SOLN
INTRAVENOUS | Status: AC
  Filled 2020-08-24: qty 1

## 2020-08-24 MED ORDER — PROMETHAZINE HCL 25 MG/ML IJ SOLN: 6.2500 mg | INTRAMUSCULAR | Status: DC | PRN

## 2020-08-24 MED ORDER — SODIUM CHLORIDE 0.9 % IV SOLN
INTRAVENOUS | Status: DC | PRN
  Administered 2020-08-24: 600 mL

## 2020-08-24 MED ORDER — ALPRAZOLAM 0.25 MG PO TABS: 0.2500 mg | ORAL_TABLET | Freq: Every evening | ORAL | Status: DC | PRN

## 2020-08-24 MED ORDER — CEFAZOLIN SODIUM-DEXTROSE 2-4 GM/100ML-% IV SOLN
INTRAVENOUS | Status: AC
  Filled 2020-08-24: qty 100

## 2020-08-24 MED ORDER — SODIUM CHLORIDE (PF) 0.9 % IJ SOLN
INTRAMUSCULAR | Status: DC | PRN
  Administered 2020-08-24: 1 mL

## 2020-08-24 MED ORDER — METHYLENE BLUE 0.5 % INJ SOLN
INTRAVENOUS | Status: DC | PRN
  Administered 2020-08-24: 4 mL

## 2020-08-24 MED ORDER — SODIUM CHLORIDE 0.9 % IV SOLN
INTRAVENOUS | Status: AC
  Filled 2020-08-24: qty 10

## 2020-08-24 MED ORDER — SCOPOLAMINE 1 MG/3DAYS TD PT72
MEDICATED_PATCH | TRANSDERMAL | Status: AC
  Filled 2020-08-24: qty 1

## 2020-08-24 MED ORDER — DEXAMETHASONE SODIUM PHOSPHATE 4 MG/ML IJ SOLN
INTRAMUSCULAR | Status: DC | PRN
  Administered 2020-08-24: 10 mg via INTRAVENOUS

## 2020-08-24 MED ORDER — ONDANSETRON HCL 4 MG/2ML IJ SOLN
INTRAMUSCULAR | Status: AC
  Filled 2020-08-24: qty 2

## 2020-08-24 MED ORDER — 0.9 % SODIUM CHLORIDE (POUR BTL) OPTIME
TOPICAL | Status: DC | PRN
  Administered 2020-08-24: 1000 mL

## 2020-08-24 MED ORDER — ONDANSETRON HCL 4 MG/2ML IJ SOLN
INTRAMUSCULAR | Status: DC | PRN
  Administered 2020-08-24: 4 mg via INTRAVENOUS

## 2020-08-24 MED ORDER — ACETAMINOPHEN 500 MG PO TABS: 1000.0000 mg | ORAL_TABLET | ORAL | Status: DC

## 2020-08-24 MED ORDER — SCOPOLAMINE 1 MG/3DAYS TD PT72
1.0000 | MEDICATED_PATCH | TRANSDERMAL | Status: DC
  Administered 2020-08-24: 1.5 mg via TRANSDERMAL

## 2020-08-24 MED ORDER — KCL IN DEXTROSE-NACL 20-5-0.45 MEQ/L-%-% IV SOLN
INTRAVENOUS | Status: DC
  Filled 2020-08-24: qty 1000

## 2020-08-24 MED ORDER — CELECOXIB 200 MG PO CAPS
ORAL_CAPSULE | ORAL | Status: AC
  Filled 2020-08-24: qty 1

## 2020-08-24 MED ORDER — SUGAMMADEX SODIUM 200 MG/2ML IV SOLN
INTRAVENOUS | Status: DC | PRN
  Administered 2020-08-24: 200 mg via INTRAVENOUS

## 2020-08-24 MED ORDER — LACTATED RINGERS IV SOLN: INTRAVENOUS | Status: DC

## 2020-08-24 MED ORDER — MIDAZOLAM HCL 2 MG/2ML IJ SOLN
2.0000 mg | Freq: Once | INTRAMUSCULAR | Status: AC
  Administered 2020-08-24: 2 mg via INTRAVENOUS

## 2020-08-24 MED ORDER — FENTANYL CITRATE (PF) 100 MCG/2ML IJ SOLN
25.0000 ug | INTRAMUSCULAR | Status: DC | PRN
  Administered 2020-08-24: 25 ug via INTRAVENOUS

## 2020-08-24 MED ORDER — SODIUM CHLORIDE (PF) 0.9 % IJ SOLN
INTRAMUSCULAR | Status: AC
  Filled 2020-08-24: qty 10

## 2020-08-24 MED ORDER — BUPIVACAINE HCL (PF) 0.25 % IJ SOLN
INTRAMUSCULAR | Status: DC | PRN
  Administered 2020-08-24 (×2): 10 mL via EPIDURAL

## 2020-08-24 MED ORDER — POVIDONE-IODINE 10 % EX SOLN
CUTANEOUS | Status: DC | PRN
  Administered 2020-08-24: 1 via TOPICAL

## 2020-08-24 MED ORDER — BUPIVACAINE LIPOSOME 1.3 % IJ SUSP
INTRAMUSCULAR | Status: DC | PRN
  Administered 2020-08-24 (×2): 10 mL

## 2020-08-24 MED ORDER — AMISULPRIDE (ANTIEMETIC) 5 MG/2ML IV SOLN
INTRAVENOUS | Status: AC
  Filled 2020-08-24: qty 4

## 2020-08-24 MED ORDER — CHLORHEXIDINE GLUCONATE CLOTH 2 % EX PADS: 6.0000 | MEDICATED_PAD | Freq: Once | CUTANEOUS | Status: DC

## 2020-08-24 MED ORDER — ACETAMINOPHEN 500 MG PO TABS
1000.0000 mg | ORAL_TABLET | Freq: Once | ORAL | Status: AC
  Administered 2020-08-24: 1000 mg via ORAL

## 2020-08-24 MED ORDER — KETOROLAC TROMETHAMINE 30 MG/ML IJ SOLN
30.0000 mg | Freq: Three times a day (TID) | INTRAMUSCULAR | Status: AC
  Administered 2020-08-24 – 2020-08-25 (×3): 30 mg via INTRAVENOUS
  Filled 2020-08-24 (×3): qty 1

## 2020-08-24 MED ORDER — CEFAZOLIN SODIUM-DEXTROSE 2-4 GM/100ML-% IV SOLN
2.0000 g | INTRAVENOUS | Status: AC
  Administered 2020-08-24: 2 g via INTRAVENOUS

## 2020-08-24 MED ORDER — CELECOXIB 200 MG PO CAPS
200.0000 mg | ORAL_CAPSULE | Freq: Once | ORAL | Status: AC
  Administered 2020-08-24: 200 mg via ORAL

## 2020-08-24 MED ORDER — ACETAMINOPHEN 500 MG PO TABS
ORAL_TABLET | ORAL | Status: AC
  Filled 2020-08-24: qty 2

## 2020-08-24 MED ORDER — MIDAZOLAM HCL 2 MG/2ML IJ SOLN
INTRAMUSCULAR | Status: AC
  Filled 2020-08-24: qty 2

## 2020-08-24 MED ORDER — METHYLENE BLUE 0.5 % INJ SOLN
INTRAVENOUS | Status: AC
  Filled 2020-08-24: qty 10

## 2020-08-24 MED ORDER — ROCURONIUM BROMIDE 100 MG/10ML IV SOLN
INTRAVENOUS | Status: DC | PRN
  Administered 2020-08-24: 70 mg via INTRAVENOUS

## 2020-08-24 MED ORDER — PROPOFOL 10 MG/ML IV BOLUS
INTRAVENOUS | Status: DC | PRN
  Administered 2020-08-24: 200 mg via INTRAVENOUS

## 2020-08-24 MED ORDER — TECHNETIUM TC 99M TILMANOCEPT KIT
1.0000 | PACK | Freq: Once | INTRAVENOUS | Status: AC | PRN
  Administered 2020-08-24: 1 via INTRADERMAL

## 2020-08-24 MED ORDER — ONDANSETRON 4 MG PO TBDP: 4.0000 mg | ORAL_TABLET | Freq: Four times a day (QID) | ORAL | Status: DC | PRN

## 2020-08-24 MED ORDER — CEFAZOLIN SODIUM-DEXTROSE 1-4 GM/50ML-% IV SOLN
1.0000 g | Freq: Three times a day (TID) | INTRAVENOUS | Status: AC
  Administered 2020-08-24 – 2020-08-25 (×3): 1 g via INTRAVENOUS
  Filled 2020-08-24 (×3): qty 50

## 2020-08-24 MED ORDER — HYDROMORPHONE HCL 1 MG/ML IJ SOLN: 0.5000 mg | INTRAMUSCULAR | Status: DC | PRN

## 2020-08-24 MED ORDER — THYROID 60 MG PO TABS
60.0000 mg | ORAL_TABLET | Freq: Every day | ORAL | Status: DC
  Filled 2020-08-24: qty 1

## 2020-08-24 MED ORDER — ROCURONIUM BROMIDE 10 MG/ML (PF) SYRINGE
PREFILLED_SYRINGE | INTRAVENOUS | Status: AC
  Filled 2020-08-24: qty 10

## 2020-08-24 MED ORDER — AMISULPRIDE (ANTIEMETIC) 5 MG/2ML IV SOLN
10.0000 mg | Freq: Once | INTRAVENOUS | Status: AC | PRN
  Administered 2020-08-24: 10 mg via INTRAVENOUS

## 2020-08-24 MED ORDER — DEXAMETHASONE SODIUM PHOSPHATE 10 MG/ML IJ SOLN
INTRAMUSCULAR | Status: AC
  Filled 2020-08-24: qty 1

## 2020-08-24 MED ORDER — ENOXAPARIN SODIUM 40 MG/0.4ML IJ SOSY: 40.0000 mg | PREFILLED_SYRINGE | INTRAMUSCULAR | Status: DC

## 2020-08-24 MED ORDER — PHENYLEPHRINE HCL-NACL 20-0.9 MG/250ML-% IV SOLN
INTRAVENOUS | Status: DC | PRN
  Administered 2020-08-24: 50 ug/min via INTRAVENOUS

## 2020-08-24 SURGICAL SUPPLY — 102 items
ALLOGRAFT PERF 16X20 1.6+/-0.4 (Tissue) ×4 IMPLANT
APPLIER CLIP 9.375 MED OPEN (MISCELLANEOUS) ×5
BAG DECANTER FOR FLEXI CONT (MISCELLANEOUS) ×5 IMPLANT
BENZOIN TINCTURE PRP APPL 2/3 (GAUZE/BANDAGES/DRESSINGS) ×3 IMPLANT
BINDER BREAST LRG (GAUZE/BANDAGES/DRESSINGS) ×2 IMPLANT
BINDER BREAST MEDIUM (GAUZE/BANDAGES/DRESSINGS) IMPLANT
BINDER BREAST XLRG (GAUZE/BANDAGES/DRESSINGS) IMPLANT
BINDER BREAST XXLRG (GAUZE/BANDAGES/DRESSINGS) IMPLANT
BLADE CLIPPER SURG (BLADE) IMPLANT
BLADE HEX COATED 2.75 (ELECTRODE) ×5 IMPLANT
BLADE SURG 10 STRL SS (BLADE) ×7 IMPLANT
BLADE SURG 15 STRL LF DISP TIS (BLADE) ×4 IMPLANT
BLADE SURG 15 STRL SS (BLADE) ×5
BNDG ELASTIC 6X5.8 VLCR STR LF (GAUZE/BANDAGES/DRESSINGS) IMPLANT
BNDG GAUZE ELAST 4 BULKY (GAUZE/BANDAGES/DRESSINGS) ×10 IMPLANT
CANISTER SUCT 1200ML W/VALVE (MISCELLANEOUS) ×5 IMPLANT
CHLORAPREP W/TINT 26 (MISCELLANEOUS) ×10 IMPLANT
CLIP APPLIE 9.375 MED OPEN (MISCELLANEOUS) ×1 IMPLANT
COUNTER NEEDLE 1200 MAGNETIC (NEEDLE) IMPLANT
COVER BACK TABLE 60X90IN (DRAPES) ×5 IMPLANT
COVER MAYO STAND STRL (DRAPES) ×10 IMPLANT
COVER PROBE W GEL 5X96 (DRAPES) ×5 IMPLANT
DECANTER SPIKE VIAL GLASS SM (MISCELLANEOUS) ×5 IMPLANT
DERMABOND ADVANCED (GAUZE/BANDAGES/DRESSINGS) ×2
DERMABOND ADVANCED .7 DNX12 (GAUZE/BANDAGES/DRESSINGS) ×5 IMPLANT
DRAIN CHANNEL 15F RND FF W/TCR (WOUND CARE) ×7 IMPLANT
DRAIN CHANNEL 19F RND (DRAIN) ×10 IMPLANT
DRAIN HEMOVAC 1/8 X 5 (WOUND CARE) IMPLANT
DRAPE INCISE IOBAN 66X45 STRL (DRAPES) ×5 IMPLANT
DRAPE LAPAROSCOPIC ABDOMINAL (DRAPES) ×3 IMPLANT
DRAPE TOP ARMCOVERS (MISCELLANEOUS) ×5 IMPLANT
DRAPE U-SHAPE 76X120 STRL (DRAPES) ×7 IMPLANT
DRAPE UTILITY XL STRL (DRAPES) ×7 IMPLANT
DRSG PAD ABDOMINAL 8X10 ST (GAUZE/BANDAGES/DRESSINGS) ×10 IMPLANT
DRSG TEGADERM 4X10 (GAUZE/BANDAGES/DRESSINGS) ×6 IMPLANT
ELECT BLADE 4.0 EZ CLEAN MEGAD (MISCELLANEOUS) ×5
ELECT COATED BLADE 2.86 ST (ELECTRODE) ×5 IMPLANT
ELECT REM PT RETURN 9FT ADLT (ELECTROSURGICAL) ×5
ELECTRODE BLDE 4.0 EZ CLN MEGD (MISCELLANEOUS) ×4 IMPLANT
ELECTRODE REM PT RTRN 9FT ADLT (ELECTROSURGICAL) ×4 IMPLANT
EVACUATOR SILICONE 100CC (DRAIN) ×14 IMPLANT
EXPANDER TISSUE FORTE 300CC (Breast) ×2 IMPLANT
GAUZE SPONGE 4X4 12PLY STRL (GAUZE/BANDAGES/DRESSINGS) ×3 IMPLANT
GAUZE SPONGE 4X4 12PLY STRL LF (GAUZE/BANDAGES/DRESSINGS) IMPLANT
GLOVE SURG ENC MOIS LTX SZ6.5 (GLOVE) ×2 IMPLANT
GLOVE SURG ENC MOIS LTX SZ7 (GLOVE) ×5 IMPLANT
GLOVE SURG HYDRASOFT LTX SZ5.5 (GLOVE) ×10 IMPLANT
GLOVE SURG UNDER POLY LF SZ6.5 (GLOVE) ×2 IMPLANT
GLOVE SURG UNDER POLY LF SZ7 (GLOVE) ×2 IMPLANT
GLOVE SURG UNDER POLY LF SZ7.5 (GLOVE) ×5 IMPLANT
GOWN STRL REUS W/ TWL LRG LVL3 (GOWN DISPOSABLE) ×16 IMPLANT
GOWN STRL REUS W/TWL LRG LVL3 (GOWN DISPOSABLE) ×20
IV NS 500ML (IV SOLUTION)
IV NS 500ML BAXH (IV SOLUTION) ×3 IMPLANT
KIT FILL SYSTEM UNIVERSAL (SET/KITS/TRAYS/PACK) IMPLANT
MARKER SKIN DUAL TIP RULER LAB (MISCELLANEOUS) IMPLANT
NDL FILTER BLUNT 18X1 1/2 (NEEDLE) IMPLANT
NDL HYPO 25X1 1.5 SAFETY (NEEDLE) ×6 IMPLANT
NDL SAFETY ECLIPSE 18X1.5 (NEEDLE) ×3 IMPLANT
NEEDLE FILTER BLUNT 18X 1/2SAF (NEEDLE)
NEEDLE FILTER BLUNT 18X1 1/2 (NEEDLE) IMPLANT
NEEDLE HYPO 18GX1.5 SHARP (NEEDLE)
NEEDLE HYPO 25X1 1.5 SAFETY (NEEDLE) ×5 IMPLANT
NS IRRIG 1000ML POUR BTL (IV SOLUTION) ×2 IMPLANT
PACK BASIN DAY SURGERY FS (CUSTOM PROCEDURE TRAY) ×5 IMPLANT
PENCIL SMOKE EVACUATOR (MISCELLANEOUS) ×5 IMPLANT
PIN SAFETY STERILE (MISCELLANEOUS) ×5 IMPLANT
PUNCH BIOPSY DERMAL 4MM (MISCELLANEOUS) IMPLANT
SHEET MEDIUM DRAPE 40X70 STRL (DRAPES) ×7 IMPLANT
SLEEVE SCD COMPRESS KNEE MED (STOCKING) ×5 IMPLANT
SPONGE T-LAP 18X18 ~~LOC~~+RFID (SPONGE) ×17 IMPLANT
SPONGE T-LAP 4X18 ~~LOC~~+RFID (SPONGE) IMPLANT
STAPLER VISISTAT 35W (STAPLE) ×5 IMPLANT
STRIP CLOSURE SKIN 1/2X4 (GAUZE/BANDAGES/DRESSINGS) ×3 IMPLANT
SUT CHROMIC 4 0 PS 2 18 (SUTURE) ×10 IMPLANT
SUT ETHILON 2 0 FS 18 (SUTURE) ×7 IMPLANT
SUT MNCRL AB 4-0 PS2 18 (SUTURE) ×7 IMPLANT
SUT PDS 3-0 CT2 (SUTURE)
SUT PDS AB 2-0 CT2 27 (SUTURE) IMPLANT
SUT PDS II 3-0 CT2 27 ABS (SUTURE) IMPLANT
SUT SILK 2 0 SH (SUTURE) ×3 IMPLANT
SUT VIC AB 3-0 PS1 18 (SUTURE)
SUT VIC AB 3-0 PS1 18XBRD (SUTURE) ×3 IMPLANT
SUT VIC AB 3-0 SH 27 (SUTURE) ×10
SUT VIC AB 3-0 SH 27X BRD (SUTURE) ×2 IMPLANT
SUT VICRYL 0 CT-2 (SUTURE) ×6 IMPLANT
SUT VICRYL 3-0 CR8 SH (SUTURE) ×5 IMPLANT
SUT VICRYL 4-0 PS2 18IN ABS (SUTURE) ×4 IMPLANT
SUT VLOC 180 0 24IN GS25 (SUTURE) ×4 IMPLANT
SYR 20ML LL LF (SYRINGE) IMPLANT
SYR 50ML LL SCALE MARK (SYRINGE) ×5 IMPLANT
SYR BULB IRRIG 60ML STRL (SYRINGE) ×5 IMPLANT
SYR CONTROL 10ML LL (SYRINGE) ×5 IMPLANT
TAPE MEASURE VINYL STERILE (MISCELLANEOUS) IMPLANT
TISSUE EXPANDER FORTE 300CC (Breast) ×10 IMPLANT
TOWEL GREEN STERILE FF (TOWEL DISPOSABLE) ×10 IMPLANT
TRAY DSU PREP LF (CUSTOM PROCEDURE TRAY) ×5 IMPLANT
TRAY FAXITRON CT DISP (TRAY / TRAY PROCEDURE) ×3 IMPLANT
TRAY FOLEY W/BAG SLVR 14FR LF (SET/KITS/TRAYS/PACK) ×2 IMPLANT
TUBE CONNECTING 20X1/4 (TUBING) ×7 IMPLANT
UNDERPAD 30X36 HEAVY ABSORB (UNDERPADS AND DIAPERS) ×10 IMPLANT
YANKAUER SUCT BULB TIP NO VENT (SUCTIONS) ×5 IMPLANT

## 2020-08-24 NOTE — Progress Notes (Signed)
Assisted Dr. Tobias Alexander with right and left, ultrasound guided, pectoralis blocks. Side rails up, monitors on throughout procedure. See vital signs in flow sheet. Tolerated Procedure well.

## 2020-08-24 NOTE — Anesthesia Postprocedure Evaluation (Signed)
Anesthesia Post Note  Patient: [place]  Procedure(s) Performed: LEFT NIPPLE SPARING MASTECTOMY WITH SENTINEL LYMPH NODE BIOPSY (Left: Breast) RIGHT NIPPLE SPARING MASTECTOMY (Right: Breast) BILATERAL BREAST RECONSTRUCTION WITH PLACEMENT OF TISSUE EXPANDER AND ALLODERM (Bilateral: Breast)     Patient location during evaluation: PACU Anesthesia Type: General Level of consciousness: sedated Pain management: pain level controlled Vital Signs Assessment: post-procedure vital signs reviewed and stable Respiratory status: spontaneous breathing and respiratory function stable Cardiovascular status: stable Postop Assessment: no apparent nausea or vomiting Anesthetic complications: yes   No notable events documented.  Last Vitals:  Vitals:   08/24/20 1345 08/24/20 1415  BP: (!) 91/54 97/65  Pulse: 80 83  Resp: 14 16  Temp:  36.7 C  SpO2: 94% 98%    Last Pain:  Vitals:   08/24/20 1415  TempSrc:   PainSc: 5                  Bond Grieshop DANIEL

## 2020-08-24 NOTE — H&P (Signed)
History of Present Illness Imogene Burn. Khanh Cordner MD; 07/07/2020 11:45 AM) The patient is a 50 year old female who presents with breast cancer. Breast MDC 07/07/20 Lowanda Foster   This is a 50 year old female in good health who presents with a family history of breast cancer in her mother at age 87 and again at age 38.  No previous genetic testing.  She has been screened over the last few years with MRI for high-risk.  A few years ago, she had a right breast biopsy that was benign.  Recently, she was found to have a suspicious 6 mm area in the left upper central breast.  Biopsy revealed DCIS - low grade, ER/PR +.  She presents today for treatment discussion.  MRI and mammogram show no other suspicious areas.   At the beginning of our discussion, she is already leaning towards bilateral mastectomies with reconstruction.     CLINICAL DATA:  50 year old female with a family history of breast cancer including in her mother diagnosed at age 16. Prior benign MRI biopsy of the right breast 2017 with pathology revealing fibrocystic change with adenosis, calcifications, PASH and usual ductal hyperplasia. High risk screening breast MRI.   LABS:  Not applicable.   EXAM: BILATERAL BREAST MRI WITH AND WITHOUT CONTRAST   TECHNIQUE: Multiplanar, multisequence MR images of both breasts were obtained prior to and following the intravenous administration of 6 ml of Gadavist   Three-dimensional MR images were rendered by post-processing of the original MR data on an independent workstation. The three-dimensional MR images were interpreted, and findings are reported in the following complete MRI report for this study. Three dimensional images were evaluated at the independent interpreting workstation using the DynaCAD thin client.   COMPARISON:  Previous exams.   FINDINGS: Breast composition: c.  Heterogeneous fibroglandular tissue.   Background parenchymal enhancement: Mild.   Right breast: No  suspicious enhancing masses or abnormal areas of enhancement in the right breast to suggest malignancy. Biopsy clip artifact noted in the outer right breast at site of prior benign MRI guided biopsy.   Left breast: There is an oval enhancing mass in the central upper left breast measuring 0.6 cm (subtraction image 74). No additional suspicious areas of enhancement identified in the left breast.   Lymph nodes: No abnormal appearing lymph nodes.   Ancillary findings:  None.   IMPRESSION: 1. Indeterminate 0.6 cm oval enhancing mass in the central upper left breast.   2.  No MRI evidence of malignancy in the right breast.   RECOMMENDATION: Recommend the patient return for a second-look ultrasound to evaluate for a sonographic correlate for the 0.6 cm enhancing mass in the central upper left breast. If this mass can be identified sonographically it would be easier to biopsy under ultrasound guidance given patient's breast size however if this mass cannot be found sonographically than proceeding with MRI guided biopsy is recommended.   BI-RADS CATEGORY  4: Suspicious.     Electronically Signed  By: Everlean Alstrom M.D.  On: 06/03/2020 13:27       Past Surgical History Conni Slipper, RN; 07/07/2020 7:59 AM) Breast Biopsy  multiple Cesarean Section - Multiple    Diagnostic Studies History Conni Slipper, RN; 07/07/2020 7:59 AM) Colonoscopy  never Mammogram  within last year Pap Smear  1-5 years ago   Allergies Rodman Key K. Ahmaud Duthie, MD; 07/07/2020 11:45 AM) No Known Allergies  [07/06/2020]:   Medication History Imogene Burn. Abdias Hickam, MD; 07/07/2020 11:45 AM) Medications Reconciled  ALPRAZolam  (0.'25MG'$  Tablet, Oral) Active. NP Thyroid  ('60MG'$  Tablet, Oral) Active. valACYclovir HCl  (1GM Tablet, Oral) Active. Testosterone  Active. Clindamycin Phosphate  (1% Lotion, External) Active.   Social History Conni Slipper, RN; 07/07/2020 7:59 AM) Alcohol use  Occasional alcohol use. Caffeine  use  Coffee. No drug use  Tobacco use  Never smoker.   Family History Conni Slipper, RN; 07/07/2020 7:59 AM) Arthritis  Father. Breast Cancer  Mother. Cancer  Mother. Colon Polyps  Mother.   Pregnancy / Birth History Conni Slipper, RN; 07/07/2020 7:59 AM) Age at menarche  12 years. Gravida  3 Irregular periods  Length (months) of breastfeeding  3-6 Maternal age  59-30 Para  2   Other Problems Conni Slipper, RN; 07/07/2020 7:59 AM) Breast Cancer          Review of Systems Conni Slipper RN; 07/07/2020 7:59 AM) General Not Present- Appetite Loss, Chills, Fatigue, Fever, Night Sweats, Weight Gain and Weight Loss. Skin Not Present- Change in Wart/Mole, Dryness, Hives, Jaundice, New Lesions, Non-Healing Wounds, Rash and Ulcer. HEENT Present- Wears glasses/contact lenses. Not Present- Earache, Hearing Loss, Hoarseness, Nose Bleed, Oral Ulcers, Ringing in the Ears, Seasonal Allergies, Sinus Pain, Sore Throat, Visual Disturbances and Yellow Eyes. Respiratory Not Present- Bloody sputum, Chronic Cough, Difficulty Breathing, Snoring and Wheezing. Breast Not Present- Breast Mass, Breast Pain, Nipple Discharge and Skin Changes. Cardiovascular Not Present- Chest Pain, Difficulty Breathing Lying Down, Leg Cramps, Palpitations, Rapid Heart Rate, Shortness of Breath and Swelling of Extremities. Gastrointestinal Not Present- Abdominal Pain, Bloating, Bloody Stool, Change in Bowel Habits, Chronic diarrhea, Constipation, Difficulty Swallowing, Excessive gas, Gets full quickly at meals, Hemorrhoids, Indigestion, Nausea, Rectal Pain and Vomiting. Female Genitourinary Not Present- Frequency, Nocturia, Painful Urination, Pelvic Pain and Urgency. Musculoskeletal Not Present- Back Pain, Joint Pain, Joint Stiffness, Muscle Pain, Muscle Weakness and Swelling of Extremities. Neurological Not Present- Decreased Memory, Fainting, Headaches, Numbness, Seizures, Tingling, Tremor, Trouble walking and Weakness. Psychiatric  Not Present- Anxiety, Bipolar, Change in Sleep Pattern, Depression, Fearful and Frequent crying. Endocrine Not Present- Cold Intolerance, Excessive Hunger, Hair Changes, Heat Intolerance, Hot flashes and New Diabetes. Hematology Not Present- Blood Thinners, Easy Bruising, Excessive bleeding, Gland problems, HIV and Persistent Infections.     Physical Exam Rodman Key K. Aasim Restivo MD; 07/07/2020 11:46 AM)   The physical exam findings are as follows: Note: Constitutional: WDWN in NAD, conversant, no obvious deformities; resting comfortably Eyes: Pupils equal, round; sclera anicteric; moist conjunctiva; no lid lag HENT: Oral mucosa moist; good dentition Neck: No masses palpated, trachea midline; no thyromegaly Lungs: CTA bilaterally; normal respiratory effort Breasts: symmetric; bruising in left breast from previous biopsy; no other palpable masses; no axillary lymphadenopathy; no nipple changes CV: Regular rate and rhythm; no murmurs; extremities well-perfused with no edema Abd: +bowel sounds, soft, non-tender, no palpable organomegaly; no palpable hernias Musc: Normal gait; no apparent clubbing or cyanosis in extremities Lymphatic: No palpable cervical or axillary lymphadenopathy Skin: Warm, dry; no sign of jaundice Psychiatric - alert and oriented x 4; calm mood and affect       Assessment & Plan Rodman Key K. 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 Routine. Note: The patient has a relatively small area of DCIS that would be amenable to radioactive seed localized lumpectomy.  However, she has other considerations that make her lean towards bilateral mastectomies.   Genetics are pending.  With bilateral mastectomies, she would not likely need radiation or anti-estrogens.  She has a lot of anxiety related to high-risk screening and repeated biopsies, especially at  her age and with her family history.     We discussed both surgical options - left radioactive seed localized lumpectomy as well as bilateral mastectomy.  If we pursue mastectomies, she seems to be a good candidate for bilateral NSM as well as left axillary SLNB.  Genetics negative  Will proceed with bilateral NSM and left axillary SLNB.  The surgical procedure has been discussed with the patient.  Potential risks, benefits, alternative treatments, and expected outcomes have been explained.  All of the patient's questions at this time have been answered.  The likelihood of reaching the patient's treatment goal is good.  The patient understand the proposed surgical procedure and wishes to proceed.   Imogene Burn. Georgette Dover, MD, [facility] Surgery  General Surgery   08/24/2020 7:18 AM

## 2020-08-24 NOTE — Op Note (Signed)
Operative Note   DATE OF OPERATION: 8.9.22  LOCATION: [facility]-observation  SURGICAL DIVISION: Plastic Surgery  PREOPERATIVE DIAGNOSES:  1. DCIS left breast 2. Family history breast cancer  POSTOPERATIVE DIAGNOSES:  same  PROCEDURE:  1. Bilateral breast reconstruction with tissue expanders 2. Acellular dermis (Alloderm) to bilateral chest 600 cm2  SURGEON: Irene Limbo MD MBA  ASSISTANT: none  ANESTHESIA:  General.   EBL: 150 ml for entire procedure  COMPLICATIONS: None immediate.   INDICATIONS FOR PROCEDURE:  The patient, Amanda Hawkins, is a 50 y.o. female born on 12/20/1970, is here for immediate prepectoral expander based reconstruction following nipple sparing mastectomies.   FINDINGS: Natrelle 133S [id number] T tissue expanders placed bilateral. Initial fill volume 60 ml air bilateral. RIGHT SN [id number] LEFT SN [id number]  DESCRIPTION OF PROCEDURE:  The patient was marked with the patient in the preoperative area to mark sternal notch, chest midline, anterior axillary lines and inframammary folds. The patient's operative site was prepped and draped in a sterile fashion. Foley catheter placed. A time out was performed and all information was confirmed to be correct. I assisted in mastectomies with exposure and retraction. Following completion of mastectomies, reconstruction began on right chest.   The right breast cavity was irrigated with saline solution containing Ancef, gentamicin, and betadine. Hemostasis was ensured. Laterally the mastectomy flap over posterior axillary line was advanced anteriorly and the subcutaneous tissue and superficial fascia was secured to pectoralis and serratus muscles with 0-vicryl. A 19 Fr drain was placed in subcutaneous position laterally and a 15 Fr drain placed along inframammary fold. Each secured to skin with 2-0 nylon. The expander was prepared on back table prior in insertion. Perforated acellular dermis was draped over anterior  surface implant. The ADM was then secured to itself over posterior surface of implant with 4-0 chromic. Redundant folds acellular dermis excised so that the ADM lay flat without folds over implant. The expander ADM construct was placed within cavity. The ADM was secured to fascia over lateral sternal border with a 0 vicryl. The ADM was was also secured to pectoralis muscle with 0-vicryl over lower outer border with 0 vicryl. The ADM was secured to pectoralis muscle and chest wall along inferior border at inframammary fold with 0 vicryl running suture. Skin closure completed with 3-0 vicryl in fascial layer and 4-0 vicryl in dermis. Skin closure completed with 4-0 monocryl subcuticular.   I then directed my attention to left chest where similar irrigation and drain placement completed. Laterally the mastectomy flap over posterior axillary line was advanced anteriorly and the subcutaneous tissue and superficial fascia was secured to pectoralis and serratus muscles with 0-vicryl. The prepared expander with ADM secured over anterior surface was placed in left chest. The ADM was secured to fascia over lateral sternal border with a 0 vicryl. The ADM was was also secured to pectoralis muscle with 0-vicryl over lower outer border with 0 vicryl. The acellular dermis at inframammary fold was secured to chest wall with 0 V-lock suture. Skin closure completed with 3-0 vicryl in fascial layer and 4-0 vicryl in dermis. Skin closure completed with 4-0 monocryl subcuticular. PAtient noted to have full thickness perforation mastetomy left nipple base and full thickness cautery burn over right medial upper chest. Each area sharply excised to bleeding tissue. Closure of each area completed with 4-0 vicryl in dermis, 4-0 monocryl simple skin suture. Tissue adhesive to all incisions.  The mastectomy flaps were redraped so that NACs were positioned symmetrically. Tegaderm dressings applied  followed by dry dressing, breast  binder.  The patient was allowed to wake from anesthesia, extubated and taken to the recovery room in satisfactory condition.   SPECIMENS: none  DRAINS: 15 and 19 Fr JP in right and left breast reconstruction

## 2020-08-24 NOTE — Anesthesia Procedure Notes (Signed)
Anesthesia Regional Block: Pectoralis block   Pre-Anesthetic Checklist: , timeout performed,  Correct Patient, Correct Site, Correct Laterality,  Correct Procedure, Correct Position, site marked,  Risks and benefits discussed,  Surgical consent,  Pre-op evaluation,  At surgeon's request and post-op pain management  Laterality: Left  Prep: chloraprep       Needles:  Injection technique: Single-shot  Needle Type: Echogenic Stimulator Needle     Needle Length: 10cm  Needle Gauge: 21     Additional Needles:   Procedures:,,,, ultrasound used (permanent image in chart),,    Narrative:  Start time: 08/24/2020 7:04 AM End time: 08/24/2020 7:11 AM Injection made incrementally with aspirations every 5 mL.  Performed by: Personally

## 2020-08-24 NOTE — Interval H&P Note (Signed)
History and Physical Interval Note:  08/24/2020 7:43 AM  Amanda Hawkins  has presented today for surgery, with the diagnosis of LEFT BREAST DCIS/HIGH RISK RIGHT BREAST CANCER.  The various methods of treatment have been discussed with the patient and family. After consideration of risks, benefits and other options for treatment, the patient has consented to  Procedure(s): LEFT NIPPLE SPARING MASTECTOMY WITH SENTINEL LYMPH NODE BIOPSY (Left) RIGHT NIPPLE SPARING MASTECTOMY (Right) BILATERAL BREAST RECONSTRUCTION WITH PLACEMENT OF TISSUE EXPANDER AND ALLODERM (Bilateral) POSSIBLE PLACEMENT OF BREAST IMPLANTS (Bilateral) as a surgical intervention.  The patient's history has been reviewed, patient examined, no change in status, stable for surgery.  I have reviewed the patient's chart and labs.  Questions were answered to the patient's satisfaction.     Arnoldo Hooker Hermila Millis

## 2020-08-24 NOTE — Anesthesia Procedure Notes (Signed)
Anesthesia Regional Block: Pectoralis block   Pre-Anesthetic Checklist: , timeout performed,  Correct Patient, Correct Site, Correct Laterality,  Correct Procedure, Correct Position, site marked,  Risks and benefits discussed,  Surgical consent,  Pre-op evaluation,  At surgeon's request and post-op pain management  Laterality: Right  Prep: chloraprep       Needles:  Injection technique: Single-shot  Needle Type: Echogenic Stimulator Needle     Needle Length: 10cm  Needle Gauge: 21     Additional Needles:   Procedures:,,,, ultrasound used (permanent image in chart),,    Narrative:  Start time: 08/24/2020 7:11 AM End time: 08/24/2020 7:17 AM Injection made incrementally with aspirations every 5 mL.  Performed by: Personally

## 2020-08-24 NOTE — Progress Notes (Signed)
Nuc med at bedside for injections on the left breast.  Pt tolerated procedure well, and VSS.

## 2020-08-24 NOTE — Op Note (Signed)
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 Co-surgeon:  Dr. Irene Limbo Anesthesia:  GEN - LMA/ bilateral PEC block Indications:  This is a 50 year old female in good health who presents with a family history of breast cancer in her mother at age 54 and again at age 20.  No previous genetic testing.  She has been screened over the last few years with MRI for high-risk.  A few years ago, she had a right breast biopsy that was benign.  Recently, she was found to have a suspicious 6 mm area in the left upper central breast.  Biopsy revealed DCIS - low grade, ER/PR +.  She presents today for treatment discussion.  MRI and mammogram show no other suspicious areas.  Genetic testing was negative.  Patient has opted for bilateral mastectomies.  She will have immediate reconstruction after bilateral nipple sparing mastectomies.   Description of procedure: The patient is brought to the operating room placed in supine position on the operating room table. After an adequate level of general anesthesia was obtained, a Foley catheter was placed under sterile technique and  we injected methylene blue dye solution into the dermis around her left nipple.    We began our procedure on the right side.  An inframammary incision had previously been outlined by plastic surgery.  I made the incision and we dissected down to the chest wall at the inframammary crease.  We then dissected the posterior surface of the breast tissue off of the underlying pectoralis muscle.  We dissected medially to the edge of the sternum.  Superiorly, we dissected to the chest wall below the clavicle.  Laterally we dissected to the lateral edge of the breast tissue anterior to the latissimus muscle.   Once we had completed our posterior dissection we used cautery and mastectomy hooks to dissect our anterior flap.  We dissected to the same margins.  The entire specimen was removed and was oriented with a long suture lateral and a short suture superior.  A separate biopsy was taken of the posterior surface of the nipple.  We inspected for hemostasis.  The wound was then packed with a saline moistened gauze.  We then turned our attention to the left side.  We made a similar inframammary incision.  We performed a similar dissection as the right side.  Once we had completely remove the breast tissue we oriented with a long suture lateral and a short stitch superior.  We then reinspected our skin flaps.  The medial breast just adjacent to the nipple seemed thick so we took an additional margin.  This additional anterior margin was oriented with a long suture lateral and a short suture superior.  We used clips and cautery for hemostasis.  We then turned our attention to the axilla.  The neoprobe was used to interrogate the axilla.  I made a transverse incision over the area of activity.  We dissected into the axilla and identified a radioactive lymph node.  Some blue dye was seen within the node.  This was dissected free and sent as "sentinel lymph node #1".  There is a second non-blue, non- radioactive lymph node immediately adjacent to this area that was taken as well.  There was minimal background activity.  The axillary wound was closed with  a deep layer of 3-0 Vicryl and a subcuticular layer of 4-0 Monocryl.  The patient is turned over to Dr. Iran Planas for reconstruction and closure of the remainder of the incisions.  Imogene Burn. Georgette Dover, MD, [facility] Surgery  General/ Trauma Surgery  12/08/2019 1:38 PM

## 2020-08-24 NOTE — Anesthesia Procedure Notes (Signed)
Procedure Name: Intubation Date/Time: 08/24/2020 8:43 AM Performed by: Tawni Millers, CRNA Pre-anesthesia Checklist: Patient identified, Emergency Drugs available, Suction available and Patient being monitored Patient Re-evaluated:Patient Re-evaluated prior to induction Oxygen Delivery Method: Circle system utilized Preoxygenation: Pre-oxygenation with 100% oxygen Induction Type: IV induction Ventilation: Mask ventilation without difficulty Laryngoscope Size: Mac and 3 Grade View: Grade I Tube type: Oral Tube size: 7.0 mm Number of attempts: 1 Airway Equipment and Method: Stylet and Oral airway Placement Confirmation: ETT inserted through vocal cords under direct vision, positive ETCO2 and breath sounds checked- equal and bilateral Tube secured with: Tape Dental Injury: Teeth and Oropharynx as per pre-operative assessment

## 2020-08-24 NOTE — Transfer of Care (Signed)
Immediate Anesthesia Transfer of Care Note  Patient: Amanda Hawkins  Procedure(s) Performed: LEFT NIPPLE SPARING MASTECTOMY WITH SENTINEL LYMPH NODE BIOPSY (Left: Breast) RIGHT NIPPLE SPARING MASTECTOMY (Right: Breast) BILATERAL BREAST RECONSTRUCTION WITH PLACEMENT OF TISSUE EXPANDER AND ALLODERM (Bilateral: Breast)  Patient Location: PACU  Anesthesia Type:GA combined with regional for post-op pain  Level of Consciousness: awake  Airway & Oxygen Therapy: Patient Spontanous Breathing and Patient connected to face mask oxygen  Post-op Assessment: Report given to RN and Post -op Vital signs reviewed and stable  Post vital signs: Reviewed and stable  Last Vitals:  Vitals Value Taken Time  BP 107/49 08/24/20 1257  Temp    Pulse 97 08/24/20 1258  Resp 16 08/24/20 1258  SpO2 100 % 08/24/20 1258  Vitals shown include unvalidated device data.  Last Pain:  Vitals:   08/24/20 0652  TempSrc: Oral  PainSc: 0-No pain      Patients Stated Pain Goal: 4 (0000000 [id number])  Complications: No notable events documented.

## 2020-08-25 ENCOUNTER — Encounter (HOSPITAL_BASED_OUTPATIENT_CLINIC_OR_DEPARTMENT_OTHER): Payer: Self-pay | Admitting: Surgery

## 2020-08-25 DIAGNOSIS — D0512 Intraductal carcinoma in situ of left breast: Secondary | ICD-10-CM | POA: Diagnosis not present

## 2020-08-25 NOTE — Discharge Summary (Signed)
Physician Discharge Summary  Patient ID: Amanda Hawkins MRN: [id number] DOB/AGE: April 06, 1970 50 y.o.  Admit date: 08/24/2020 Discharge date: 08/25/2020  Admission Diagnoses: DCIS left breast family history breast cancer  Discharge Diagnoses:  same  Discharged Condition: stable  Hospital Course: Post operatively patient did well tolerating diet, ambulatory with minimal assist and pain controlled with oral medication. Instructed on bathing and drain care.  Treatments: surgery: bilateral nipple sparing mastectomies with left sentinel node bilateral breast reconstruction with tissue expanders acellular dermis 8.9.22  Discharge Exam: Blood pressure (!) 101/52, pulse 88, temperature 98 F (36.7 C), resp. rate 16, height '5\' 5"'$  (1.651 m), weight 60 kg, last menstrual period 08/08/2020, SpO2 100 %. Incision/Wound:chest soft incisions dry intact with Tegaderms in place area epidermolysis right medial chest  Disposition: Discharge disposition: 01-Home or Self Care       Discharge Instructions     Call MD for:  redness, tenderness, or signs of infection (pain, swelling, bleeding, redness, odor or green/yellow discharge around incision site)   Complete by: As directed    Call MD for:  temperature >100.5   Complete by: As directed    Discharge instructions   Complete by: As directed    Ok to remove dressings and shower am 8.11.22. Soap and water ok, pat Tegaderms dry. Do not remove Tegaderms. No creams or ointments over incisions. Do not let drains dangle in shower, attach to lanyard or similar.Strip and record drains twice daily and bring log to clinic visit.  Breast binder or soft compression bra all other times.  Ok to raise arms above shoulders for bathing and dressing.  No house yard work or exercise until cleared by MD.   Recommend ibuprofen with meals to aid with pain control. Also ok to use Tylenol for pain. Patient received all Rx preop. Recommend Miralax or Dulcolax as needed for  constipation.   Driving Restrictions   Complete by: As directed    No driving for 2 weeks then no driving if taking prescription pain medication   Lifting restrictions   Complete by: As directed    No lifting > 5-10 lbs until cleared by MD   Resume previous diet   Complete by: As directed       Allergies as of 08/25/2020   No Known Allergies      Medication List     TAKE these medications    ALPRAZolam 0.25 MG tablet Commonly known as: XANAX Take 0.25 mg by mouth at bedtime as needed for anxiety.   TESTOSTERONE NA testosterone 2% cream  APPLY 1 OR 2 CLICKS (0000000 GRAMS) BEHIND THE KNEE DAILY.   thyroid 60 MG tablet Commonly known as: ARMOUR NP Thyroid 60 mg tablet  TAKE 1 TABLET ONCE DAILY ON EVEN DAYS AND TAKE 1&1/2 TABLETS ONCE DAILY ON ODD DAYS.   valACYclovir 1000 MG tablet Commonly known as: VALTREX Take 1,000 mg by mouth 2 (two) times daily.        Follow-up Information     Irene Limbo, MD Follow up in 1 week(s).   Specialty: Plastic Surgery Why: as scheduled Contact information: [address] [place] [address] [phone]         Donnie Mesa, MD. Schedule an appointment as soon as possible for a visit in 3 day(s).   Specialty: General Surgery Contact information: [address] [place] [address] [phone]  SignedIrene Limbo 08/25/2020, 8:14 AM

## 2020-08-25 NOTE — Progress Notes (Signed)
1 Day Post-Op   Subjective/Chief Complaint: Patient appears comfortable Pain well-managed Preparing for discharge - already examined by Dr. Iran Planas   Objective: Vital signs in last 24 hours: Temp:  [98 F (36.7 C)-99.2 F (37.3 C)] 98 F (36.7 C) (08/10 0700) Pulse Rate:  [64-102] 88 (08/10 0700) Resp:  [14-20] 16 (08/09 2200) BP: (91-107)/(49-67) 101/52 (08/10 0700) SpO2:  [94 %-100 %] 100 % (08/10 0700)    Intake/Output from previous day: 08/09 0701 - 08/10 0700 In: 2810 [P.O.:960; I.V.:1750; IV Piggyback:100] Out: 2307.5 [Urine:1950; Drains:207.5; Blood:150] Intake/Output this shift: No intake/output data recorded.  Drains - more drainage from left side; 207 ml total from all four since surgery. Skin flaps viable No obvious hematoma  Anti-infectives: Anti-infectives (From admission, onward)    Start     Dose/Rate Route Frequency Ordered Stop   08/24/20 1400  ceFAZolin (ANCEF) IVPB 1 g/50 mL premix        1 g 100 mL/hr over 30 Minutes Intravenous Every 8 hours 08/24/20 1324 08/25/20 0737   08/24/20 1115  ceFAZolin 1 g / gentamicin 80 mg in NS 500 mL surgical irrigation  Status:  Discontinued          As needed 08/24/20 1119 08/24/20 1255   08/24/20 0630  ceFAZolin (ANCEF) IVPB 2g/100 mL premix        2 g 200 mL/hr over 30 Minutes Intravenous On call to O.R. 08/24/20 0624 08/24/20 0836       Assessment/Plan: s/p Procedure(s): LEFT NIPPLE SPARING MASTECTOMY WITH SENTINEL LYMPH NODE BIOPSY (Left) RIGHT NIPPLE SPARING MASTECTOMY (Right) BILATERAL BREAST RECONSTRUCTION WITH PLACEMENT OF TISSUE EXPANDER AND ALLODERM (Bilateral) Ready for discharge Early follow-up with Dr. Iran Planas I will see her back in 3-4 weeks Will call her with path results.   LOS: 0 days    Maia Petties 08/25/2020

## 2020-08-25 NOTE — Discharge Instructions (Addendum)
About my Jackson-Pratt Bulb Drain  What is a Jackson-Pratt bulb? A Jackson-Pratt is a soft, round device used to collect drainage. It is connected to a long, thin drainage catheter, which is held in place by one or two small stiches near your surgical incision site. When the bulb is squeezed, it forms a vacuum, forcing the drainage to empty into the bulb.  Emptying the Jackson-Pratt bulb- To empty the bulb: 1. Release the plug on the top of the bulb. 2. Pour the bulb's contents into a measuring container which your nurse will provide. 3. Record the time emptied and amount of drainage. Empty the drain(s) as often as your     doctor or nurse recommends.  Date                  Time                    Amount (Drain 1)                 Amount (Drain 2)  _______________________________________________________________________________________________  _______________________________________________________________________________________________  _______________________________________________________________________________________________  _______________________________________________________________________________________________  _______________________________________________________________________________________________  _______________________________________________________________________________________________  _______________________________________________________________________________________________  _______________________________________________________________________________________________  _______________________________________________________________________________________________  _______________________________________________________________________________________________  Date                  Time                    Amount (Drain 3)                 Amount (Drain  4)  _______________________________________________________________________________________________  _______________________________________________________________________________________________  _______________________________________________________________________________________________  _______________________________________________________________________________________________  _______________________________________________________________________________________________  _______________________________________________________________________________________________  _______________________________________________________________________________________________  _______________________________________________________________________________________________  _______________________________________________________________________________________________  _______________________________________________________________________________________________  Squeezing the Jackson-Pratt Bulb- To squeeze the bulb: 1. Make sure the plug at the top of the bulb is open. 2. Squeeze the bulb tightly in your fist. You will hear air squeezing from the bulb. 3. Replace the plug while the bulb is squeezed. 4. Use a safety pin to attach the bulb to your clothing. This will keep the catheter from     pulling at the bulb insertion site.  When to call your doctor- Call your doctor if: Drain site becomes red, swollen or hot. You have a fever greater than 101 degrees F. There is oozing at the drain site. Drain falls out (apply a guaze bandage over the drain hole and secure it with tape). Drainage increases daily not related to activity patterns. (You will usually have more drainage when you are active than when you are resting.) Drainage has a bad odor.    Information for Discharge Teaching: EXPAREL (bupivacaine liposome injectable suspension)   Your surgeon or anesthesiologist gave you  EXPAREL(bupivacaine) to help control your pain after surgery.  EXPAREL is a local anesthetic that provides pain relief by numbing the tissue around the surgical site. EXPAREL is designed to release pain medication over time and can control pain for up to 72 hours. Depending on how you respond to EXPAREL, you may require less pain medication during your recovery.  Possible side effects: Temporary loss of sensation or ability to move in the area where bupivacaine was injected. Nausea, vomiting, constipation Rarely, numbness and tingling in your mouth or lips, lightheadedness, or anxiety may occur. Call your doctor right away if you think you may be experiencing any of these sensations, or if you have other questions regarding possible side effects.  Follow all other discharge instructions given to you by your surgeon or nurse. Eat a healthy diet and drink plenty  of water or other fluids.  If you return to the hospital for any reason within 96 hours following the administration of EXPAREL, it is important for health care providers to know that you have received this anesthetic. A teal colored band has been placed on your arm with the date, time and amount of EXPAREL you have received in order to alert and inform your health care providers. Please leave this armband in place for the full 96 hours following administration, and then you may remove the band.

## 2020-08-26 LAB — SURGICAL PATHOLOGY

## 2020-08-30 ENCOUNTER — Encounter: Payer: Self-pay | Admitting: *Deleted

## 2020-09-01 NOTE — Assessment & Plan Note (Addendum)
06/23/2020: High risk screening breast MRI revealed indeterminate 0.6 cm oval mass in the left breast.  Biopsy revealed low-grade DCIS ER/PR 100% positive.  Recommendation: 1. Bilateral mastectomies: no evidence of malignancy and left axillary lymph node negative for metastatic carcinoma 2. Followed by adjuvant radiation therapy 3. Followed by antiestrogen therapy with tamoxifen 5 years (if she has bilateral mastectomies she will not need radiation or antiestrogen therapy)

## 2020-09-01 NOTE — Progress Notes (Signed)
Patient Care Team: Zane Herald, MD as PCP - General (Family Medicine) Rockwell Germany, RN as Oncology Nurse Navigator Mauro Kaufmann, RN as Oncology Nurse Navigator Donnie Mesa, MD as Consulting Physician (General Surgery) Nicholas Lose, MD as Consulting Physician (Hematology and Oncology) Kyung Rudd, MD as Consulting Physician (Radiation Oncology)  DIAGNOSIS:    ICD-10-CM   1. Ductal carcinoma in situ (DCIS) of left breast  D05.12       SUMMARY OF ONCOLOGIC HISTORY: Oncology History  Ductal carcinoma in situ (DCIS) of left breast  06/23/2020 Initial Diagnosis   High-risk screening breast MRI on 06/03/20 showed indeterminate 0.6 cm oval enhancing mass in the central upper left breast and no evidence of malignancy in the right breast. 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    07/07/2020 Cancer Staging   Staging form: Breast, AJCC 8th Edition - Clinical stage from 07/07/2020: Stage 0 (cTis (DCIS), cN0, cM0, ER+, PR+) - Signed by Nicholas Lose, MD on 07/07/2020 Stage prefix: Initial diagnosis Nuclear grade: G1   07/18/2020 Genetic Testing   Negative genetic testing on the CancerNext-Expanded+RNAinsight panel testing.  The report date is July 18, 2020.  The CancerNext-Expanded gene panel offered by [facility] and includes sequencing and rearrangement analysis for the following 77 genes: AIP, ALK, APC*, ATM*, AXIN2, BAP1, BARD1, BLM, BMPR1A, BRCA1*, BRCA2*, BRIP1*, CDC73, CDH1*, CDK4, CDKN1B, CDKN2A, CHEK2*, CTNNA1, DICER1, FANCC, FH, FLCN, GALNT12, KIF1B, LZTR1, MAX, MEN1, MET, MLH1*, MSH2*, MSH3, MSH6*, MUTYH*, NBN, NF1*, NF2, NTHL1, PALB2*, PHOX2B, PMS2*, POT1, PRKAR1A, PTCH1, PTEN*, RAD51C*, RAD51D*, RB1, RECQL, RET, SDHA, SDHAF2, SDHB, SDHC, SDHD, SMAD4, SMARCA4, SMARCB1, SMARCE1, STK11, SUFU, TMEM127, TP53*, TSC1, TSC2, VHL and XRCC2 (sequencing and deletion/duplication); EGFR, EGLN1, HOXB13, KIT, MITF, PDGFRA, POLD1, and  POLE (sequencing only); EPCAM and GREM1 (deletion/duplication only). DNA and RNA analyses performed for * genes.    08/24/2020 Surgery   Bilateral mastectomies: no evidence of malignancy and left axillary lymph node negative for metastatic carcinoma     CHIEF COMPLIANT: Follow-up of left DCIS  INTERVAL HISTORY: Amanda Hawkins is a 50 y.o. with above-mentioned history of DCIS of the left breast. She underwent bilateral mastectomy on 08/24/20 with Dr. Donnie Mesa for which the pathology showed no evidence of malignancy and left axillary lymph node negative for metastatic carcinoma. She presents to the clinic today for follow-up.  She is healing and recovering from recent surgery.  She tells me that a couple of areas of the skin are not healing too well and she is following with Dr. Iran Planas for that.  She is no longer taking any narcotic pain medications.  She takes occasional Tylenol.  ALLERGIES:  has No Known Allergies.  MEDICATIONS:  Current Outpatient Medications  Medication Sig Dispense Refill   ALPRAZolam (XANAX) 0.25 MG tablet Take 0.25 mg by mouth at bedtime as needed for anxiety.     TESTOSTERONE NA testosterone 2% cream  APPLY 1 OR 2 CLICKS (5.17-0.0 GRAMS) BEHIND THE KNEE DAILY.     thyroid (ARMOUR) 60 MG tablet NP Thyroid 60 mg tablet  TAKE 1 TABLET ONCE DAILY ON EVEN DAYS AND TAKE 1&1/2 TABLETS ONCE DAILY ON ODD DAYS.     valACYclovir (VALTREX) 1000 MG tablet Take 1,000 mg by mouth 2 (two) times daily.     No current facility-administered medications for this visit.    PHYSICAL EXAMINATION: ECOG PERFORMANCE STATUS: 1 - Symptomatic but completely ambulatory  Vitals:   09/02/20 0941  BP: 111/68  Pulse: 82  Resp: 18  Temp: 97.7 F (36.5 C)  SpO2: 100%   Filed Weights   09/02/20 0941  Weight: 130 lb (59 kg)      LABORATORY DATA:  I have reviewed the data as listed CMP Latest Ref Rng & Units 07/07/2020  Glucose 70 - 99 mg/dL 99  BUN 6 - 20 mg/dL 19  Creatinine  0.44 - 1.00 mg/dL 0.86  Sodium 135 - 145 mmol/L 140  Potassium 3.5 - 5.1 mmol/L 4.3  Chloride 98 - 111 mmol/L 105  CO2 22 - 32 mmol/L 22  Calcium 8.9 - 10.3 mg/dL 9.7  Total Protein 6.5 - 8.1 g/dL 7.2  Total Bilirubin 0.3 - 1.2 mg/dL 1.4(H)  Alkaline Phos 38 - 126 U/L 36(L)  AST 15 - 41 U/L 27  ALT 0 - 44 U/L 32    Lab Results  Component Value Date   WBC 6.5 07/07/2020   HGB 14.5 07/07/2020   HCT 42.9 07/07/2020   MCV 96.6 07/07/2020   PLT 181 07/07/2020   NEUTROABS 3.8 07/07/2020    ASSESSMENT & PLAN:  Ductal carcinoma in situ (DCIS) of left breast 06/23/2020: High risk screening breast MRI revealed indeterminate 0.6 cm oval mass in the left breast.  Biopsy revealed low-grade DCIS ER/PR 100% positive.  Treatment: 08/24/2020: Bilateral mastectomies: no evidence of malignancy and left axillary lymph node negative for metastatic carcinoma  No role of radiation or antiestrogen therapy since she had bilateral mastectomies. Return to clinic on an as-needed basis.   No orders of the defined types were placed in this encounter.  The patient has a good understanding of the overall plan. she agrees with it. she will call with any problems that may develop before the next visit here.  Total time spent: 20 mins including face to face time and time spent for planning, charting and coordination of care  Rulon Eisenmenger, MD, MPH 09/02/2020  I, Thana Ates, am acting as scribe for Dr. Nicholas Lose.  I have reviewed the above documentation for accuracy and completeness, and I agree with the above.

## 2020-09-02 ENCOUNTER — Other Ambulatory Visit: Payer: Self-pay

## 2020-09-02 ENCOUNTER — Inpatient Hospital Stay
Payer: No Typology Code available for payment source | Attending: Hematology and Oncology | Admitting: Hematology and Oncology

## 2020-09-02 DIAGNOSIS — D0512 Intraductal carcinoma in situ of left breast: Secondary | ICD-10-CM | POA: Diagnosis present

## 2020-09-02 DIAGNOSIS — Z9013 Acquired absence of bilateral breasts and nipples: Secondary | ICD-10-CM | POA: Diagnosis not present

## 2020-09-02 DIAGNOSIS — Z170 Estrogen receptor positive status [ER+]: Secondary | ICD-10-CM | POA: Diagnosis not present

## 2020-09-08 ENCOUNTER — Other Ambulatory Visit: Payer: Self-pay

## 2020-09-08 ENCOUNTER — Encounter (HOSPITAL_BASED_OUTPATIENT_CLINIC_OR_DEPARTMENT_OTHER): Payer: Self-pay | Admitting: Plastic Surgery

## 2020-09-08 NOTE — H&P (Signed)
Subjective:     Patient ID: Amanda Hawkins is a 50 y.o. female.   HPI   2 weeks post op bilateral mastectomies with immediate reconstruction. Discharged from Oncology follow up. Drains both < 15 ml per day.   Presented following high risk screening MRI showing indeterminate 0.6 cm oval mass in the central upper left breast. Biopsy showed low grade DCIS, ER/PR+. Patient elected for bilateral mastectomies. Final pathology left breast no residual DCIS, 0/1 SLN.    Mother with breast ca and leukemia history. Patient previously tested negative for BRCA. Updated genetic testing 2022 (CancerNext-Expanded+RNAinsight panel) negative. Mother had from description TRAM based reconstruction at [facility]. Had mesh used in abdomen.    Prior 32 A. Does not desire significant change, "full B". Right mastectomy 93 g Left mastectomy 90 g.   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   Review of Systems Remainder 12 point review negative   Objective:   Physical Exam Cardiovascular:     Rate and Rhythm: Normal rate and regular rhythm.     Pulses: Normal pulses.     Heart sounds: Normal heart sounds.     Chest: incisions intact, NACs viable, over left chest 1 cm linear area of concern lateral to NAC with superificial wound Over right medial chest 1 area appr 1 cm width dry eschar Right IMF central incision with dry eschar   Drains serosanguinous      Assessment:     DCIS left Family history breast ca S/p bilateral NSM, left SLN, bilateral prepectoral TE/ADM (Alloderm) reconstruction    Plan:     19 Fr JPs removed. Continue light activities. Reviewed signs fluid collection including fevers increased swelling redness chest and to call ASAP if these develop.    Areas of skin flap necrosis as above. Plan bilateral chest debridement in OR tomorrow and will start expansion. Reviewed OP surgery.     Natrelle 133S [id number] T tissue expanders placed bilateral.   Initial fill volume 60 ml air bilateral

## 2020-09-09 ENCOUNTER — Ambulatory Visit (HOSPITAL_BASED_OUTPATIENT_CLINIC_OR_DEPARTMENT_OTHER): Payer: No Typology Code available for payment source | Admitting: Certified Registered"

## 2020-09-09 ENCOUNTER — Other Ambulatory Visit: Payer: Self-pay

## 2020-09-09 ENCOUNTER — Encounter (HOSPITAL_BASED_OUTPATIENT_CLINIC_OR_DEPARTMENT_OTHER)
Admission: RE | Disposition: A | Discharge status: Home / Self Care | Payer: Self-pay | Source: Home / Self Care | Attending: Plastic Surgery

## 2020-09-09 ENCOUNTER — Encounter (HOSPITAL_BASED_OUTPATIENT_CLINIC_OR_DEPARTMENT_OTHER): Payer: Self-pay | Admitting: Plastic Surgery

## 2020-09-09 ENCOUNTER — Ambulatory Visit (HOSPITAL_BASED_OUTPATIENT_CLINIC_OR_DEPARTMENT_OTHER)
Admission: RE | Admit: 2020-09-09 | Discharge: 2020-09-09 | Disposition: A | Discharge status: Home / Self Care | Payer: No Typology Code available for payment source | Attending: Plastic Surgery | Admitting: Plastic Surgery

## 2020-09-09 DIAGNOSIS — Z803 Family history of malignant neoplasm of breast: Secondary | ICD-10-CM | POA: Insufficient documentation

## 2020-09-09 DIAGNOSIS — Z86000 Personal history of in-situ neoplasm of breast: Secondary | ICD-10-CM | POA: Insufficient documentation

## 2020-09-09 DIAGNOSIS — L7682 Other postprocedural complications of skin and subcutaneous tissue: Secondary | ICD-10-CM | POA: Diagnosis not present

## 2020-09-09 DIAGNOSIS — Z806 Family history of leukemia: Secondary | ICD-10-CM | POA: Diagnosis not present

## 2020-09-09 DIAGNOSIS — Z9013 Acquired absence of bilateral breasts and nipples: Secondary | ICD-10-CM | POA: Diagnosis not present

## 2020-09-09 HISTORY — PX: DEBRIDEMENT AND CLOSURE WOUND: SHX5614

## 2020-09-09 LAB — POCT PREGNANCY, URINE: Preg Test, Ur: NEGATIVE

## 2020-09-09 SURGERY — DEBRIDEMENT, WOUND, WITH CLOSURE
Anesthesia: General | Site: Breast | Laterality: Bilateral

## 2020-09-09 MED ORDER — SODIUM CHLORIDE (PF) 0.9 % IJ SOLN
INTRAMUSCULAR | Status: DC | PRN
  Administered 2020-09-09: 50 mL

## 2020-09-09 MED ORDER — AMISULPRIDE (ANTIEMETIC) 5 MG/2ML IV SOLN: 10.0000 mg | Freq: Once | INTRAVENOUS | Status: DC | PRN

## 2020-09-09 MED ORDER — ACETAMINOPHEN 500 MG PO TABS
ORAL_TABLET | ORAL | Status: AC
  Filled 2020-09-09: qty 2

## 2020-09-09 MED ORDER — MIDAZOLAM HCL 2 MG/2ML IJ SOLN
INTRAMUSCULAR | Status: AC
  Filled 2020-09-09: qty 2

## 2020-09-09 MED ORDER — EPHEDRINE 5 MG/ML INJ
INTRAVENOUS | Status: AC
  Filled 2020-09-09: qty 10

## 2020-09-09 MED ORDER — BUPIVACAINE HCL (PF) 0.5 % IJ SOLN
INTRAMUSCULAR | Status: AC
  Filled 2020-09-09: qty 30

## 2020-09-09 MED ORDER — CEFAZOLIN SODIUM-DEXTROSE 2-4 GM/100ML-% IV SOLN
2.0000 g | INTRAVENOUS | Status: AC
  Administered 2020-09-09: 2 g via INTRAVENOUS

## 2020-09-09 MED ORDER — MIDAZOLAM HCL 5 MG/5ML IJ SOLN
INTRAMUSCULAR | Status: DC | PRN
  Administered 2020-09-09: 2 mg via INTRAVENOUS

## 2020-09-09 MED ORDER — SODIUM CHLORIDE 0.9 % IV SOLN
INTRAVENOUS | Status: AC
  Filled 2020-09-09: qty 10

## 2020-09-09 MED ORDER — CEFAZOLIN SODIUM-DEXTROSE 2-4 GM/100ML-% IV SOLN
INTRAVENOUS | Status: AC
  Filled 2020-09-09: qty 100

## 2020-09-09 MED ORDER — FENTANYL CITRATE (PF) 100 MCG/2ML IJ SOLN
INTRAMUSCULAR | Status: AC
  Filled 2020-09-09: qty 2

## 2020-09-09 MED ORDER — EPHEDRINE SULFATE 50 MG/ML IJ SOLN
INTRAMUSCULAR | Status: DC | PRN
Start: 2020-09-09 — End: 2020-09-09
  Administered 2020-09-09 (×2): 10 mg via INTRAVENOUS

## 2020-09-09 MED ORDER — CELECOXIB 200 MG PO CAPS
200.0000 mg | ORAL_CAPSULE | ORAL | Status: AC
  Administered 2020-09-09: 200 mg via ORAL

## 2020-09-09 MED ORDER — PROPOFOL 500 MG/50ML IV EMUL
INTRAVENOUS | Status: AC
  Filled 2020-09-09: qty 50

## 2020-09-09 MED ORDER — GABAPENTIN 300 MG PO CAPS
ORAL_CAPSULE | ORAL | Status: AC
  Filled 2020-09-09: qty 1

## 2020-09-09 MED ORDER — SCOPOLAMINE 1 MG/3DAYS TD PT72
MEDICATED_PATCH | TRANSDERMAL | Status: AC
  Filled 2020-09-09: qty 1

## 2020-09-09 MED ORDER — GABAPENTIN 300 MG PO CAPS
300.0000 mg | ORAL_CAPSULE | ORAL | Status: AC
  Administered 2020-09-09: 300 mg via ORAL

## 2020-09-09 MED ORDER — SCOPOLAMINE 1 MG/3DAYS TD PT72
MEDICATED_PATCH | TRANSDERMAL | Status: DC | PRN
  Administered 2020-09-09: 1 via TRANSDERMAL

## 2020-09-09 MED ORDER — ACETAMINOPHEN 500 MG PO TABS
1000.0000 mg | ORAL_TABLET | ORAL | Status: AC
  Administered 2020-09-09: 1000 mg via ORAL

## 2020-09-09 MED ORDER — DEXAMETHASONE SODIUM PHOSPHATE 10 MG/ML IJ SOLN
INTRAMUSCULAR | Status: AC
  Filled 2020-09-09: qty 1

## 2020-09-09 MED ORDER — DEXAMETHASONE SODIUM PHOSPHATE 4 MG/ML IJ SOLN
INTRAMUSCULAR | Status: DC | PRN
Start: 2020-09-09 — End: 2020-09-09
  Administered 2020-09-09: 10 mg via INTRAVENOUS

## 2020-09-09 MED ORDER — LIDOCAINE HCL (CARDIAC) PF 100 MG/5ML IV SOSY
PREFILLED_SYRINGE | INTRAVENOUS | Status: DC | PRN
  Administered 2020-09-09: 50 mg via INTRAVENOUS

## 2020-09-09 MED ORDER — CELECOXIB 200 MG PO CAPS
ORAL_CAPSULE | ORAL | Status: AC
  Filled 2020-09-09: qty 1

## 2020-09-09 MED ORDER — ONDANSETRON HCL 4 MG/2ML IJ SOLN
INTRAMUSCULAR | Status: DC | PRN
Start: 2020-09-09 — End: 2020-09-09
  Administered 2020-09-09: 4 mg via INTRAVENOUS

## 2020-09-09 MED ORDER — PROMETHAZINE HCL 25 MG/ML IJ SOLN: 6.2500 mg | INTRAMUSCULAR | Status: DC | PRN

## 2020-09-09 MED ORDER — POVIDONE-IODINE 10 % EX SOLN
CUTANEOUS | Status: DC | PRN
Start: 2020-09-09 — End: 2020-09-09
  Administered 2020-09-09: 1 via TOPICAL

## 2020-09-09 MED ORDER — LIDOCAINE HCL (PF) 2 % IJ SOLN
INTRAMUSCULAR | Status: AC
  Filled 2020-09-09: qty 5

## 2020-09-09 MED ORDER — SULFAMETHOXAZOLE-TRIMETHOPRIM 800-160 MG PO TABS: 1.0000 | ORAL_TABLET | Freq: Two times a day (BID) | ORAL | 0 refills | Status: DC

## 2020-09-09 MED ORDER — LACTATED RINGERS IV SOLN: INTRAVENOUS | Status: DC

## 2020-09-09 MED ORDER — FENTANYL CITRATE (PF) 100 MCG/2ML IJ SOLN
INTRAMUSCULAR | Status: DC | PRN
  Administered 2020-09-09: 100 ug via INTRAVENOUS

## 2020-09-09 MED ORDER — SODIUM CHLORIDE 0.9 % IV SOLN
INTRAVENOUS | Status: DC | PRN
  Administered 2020-09-09: 600 mL

## 2020-09-09 MED ORDER — PROPOFOL 10 MG/ML IV BOLUS
INTRAVENOUS | Status: DC | PRN
  Administered 2020-09-09: 160 mg via INTRAVENOUS

## 2020-09-09 MED ORDER — OXYCODONE HCL 5 MG/5ML PO SOLN: 5.0000 mg | Freq: Once | ORAL | Status: DC | PRN

## 2020-09-09 MED ORDER — MEPERIDINE HCL 25 MG/ML IJ SOLN: 6.2500 mg | INTRAMUSCULAR | Status: DC | PRN

## 2020-09-09 MED ORDER — HYDROMORPHONE HCL 1 MG/ML IJ SOLN: 0.2500 mg | INTRAMUSCULAR | Status: DC | PRN

## 2020-09-09 MED ORDER — ONDANSETRON HCL 4 MG/2ML IJ SOLN
INTRAMUSCULAR | Status: AC
  Filled 2020-09-09: qty 2

## 2020-09-09 MED ORDER — OXYCODONE HCL 5 MG PO TABS: 5.0000 mg | ORAL_TABLET | Freq: Once | ORAL | Status: DC | PRN

## 2020-09-09 SURGICAL SUPPLY — 51 items
ADH SKN CLS APL DERMABOND .7 (GAUZE/BANDAGES/DRESSINGS) ×1
APL SKNCLS STERI-STRIP NONHPOA (GAUZE/BANDAGES/DRESSINGS)
BENZOIN TINCTURE PRP APPL 2/3 (GAUZE/BANDAGES/DRESSINGS) IMPLANT
BLADE CLIPPER SURG (BLADE) IMPLANT
BLADE SURG 10 STRL SS (BLADE) IMPLANT
BLADE SURG 15 STRL LF DISP TIS (BLADE) IMPLANT
BLADE SURG 15 STRL SS (BLADE)
CANISTER SUCT 1200ML W/VALVE (MISCELLANEOUS) ×2 IMPLANT
COVER BACK TABLE 60X90IN (DRAPES) ×2 IMPLANT
COVER MAYO STAND STRL (DRAPES) ×2 IMPLANT
DERMABOND ADVANCED (GAUZE/BANDAGES/DRESSINGS) ×1
DERMABOND ADVANCED .7 DNX12 (GAUZE/BANDAGES/DRESSINGS) ×1 IMPLANT
DRAPE LAPAROSCOPIC ABDOMINAL (DRAPES) ×2 IMPLANT
DRAPE U-SHAPE 76X120 STRL (DRAPES) IMPLANT
DRAPE UTILITY XL STRL (DRAPES) ×2 IMPLANT
DRSG TEGADERM 2-3/8X2-3/4 SM (GAUZE/BANDAGES/DRESSINGS) ×4 IMPLANT
ELECT COATED BLADE 2.86 ST (ELECTRODE) ×2 IMPLANT
ELECT REM PT RETURN 9FT ADLT (ELECTROSURGICAL) ×2
ELECT REM PT RETURN 9FT PED (ELECTROSURGICAL)
ELECTRODE REM PT RETRN 9FT PED (ELECTROSURGICAL) IMPLANT
ELECTRODE REM PT RTRN 9FT ADLT (ELECTROSURGICAL) ×1 IMPLANT
GAUZE SPONGE 4X4 12PLY STRL LF (GAUZE/BANDAGES/DRESSINGS) IMPLANT
GLOVE SURG ENC MOIS LTX SZ6.5 (GLOVE) IMPLANT
GLOVE SURG HYDRASOFT LTX SZ5.5 (GLOVE) IMPLANT
GLOVE SURG UNDER POLY LF SZ6.5 (GLOVE) IMPLANT
GOWN STRL REUS W/ TWL LRG LVL3 (GOWN DISPOSABLE) ×1 IMPLANT
GOWN STRL REUS W/TWL LRG LVL3 (GOWN DISPOSABLE) ×2
NEEDLE PRECISIONGLIDE 27X1.5 (NEEDLE) IMPLANT
NS IRRIG 1000ML POUR BTL (IV SOLUTION) ×2 IMPLANT
PACK BASIN DAY SURGERY FS (CUSTOM PROCEDURE TRAY) ×2 IMPLANT
PENCIL SMOKE EVACUATOR (MISCELLANEOUS) ×2 IMPLANT
SHEET MEDIUM DRAPE 40X70 STRL (DRAPES) IMPLANT
SPONGE T-LAP 18X18 ~~LOC~~+RFID (SPONGE) ×4 IMPLANT
STRIP CLOSURE SKIN 1/2X4 (GAUZE/BANDAGES/DRESSINGS) IMPLANT
SUCTION FRAZIER HANDLE 10FR (MISCELLANEOUS)
SUCTION TUBE FRAZIER 10FR DISP (MISCELLANEOUS) IMPLANT
SUT CHROMIC 4 0 PS 2 18 (SUTURE) IMPLANT
SUT ETHILON 4 0 PS 2 18 (SUTURE) IMPLANT
SUT ETHILON 5 0 P 3 18 (SUTURE)
SUT MNCRL AB 4-0 PS2 18 (SUTURE) ×4 IMPLANT
SUT MON AB 3-0 SH 27 (SUTURE) ×2
SUT MON AB 3-0 SH27 (SUTURE) ×1 IMPLANT
SUT NYLON ETHILON 5-0 P-3 1X18 (SUTURE) IMPLANT
SUT PLAIN 5 0 P 3 18 (SUTURE) IMPLANT
SUT VICRYL 4-0 PS2 18IN ABS (SUTURE) IMPLANT
SYR BULB IRRIG 60ML STRL (SYRINGE) ×2 IMPLANT
SYR CONTROL 10ML LL (SYRINGE) IMPLANT
TOWEL GREEN STERILE FF (TOWEL DISPOSABLE) ×2 IMPLANT
TRAY DSU PREP LF (CUSTOM PROCEDURE TRAY) IMPLANT
TUBE CONNECTING 20X1/4 (TUBING) ×2 IMPLANT
YANKAUER SUCT BULB TIP NO VENT (SUCTIONS) ×2 IMPLANT

## 2020-09-09 NOTE — Op Note (Signed)
Operative Note   DATE OF OPERATION: 8.25.22  LOCATION: [facility]-outpatient  SURGICAL DIVISION: Plastic Surgery  PREOPERATIVE DIAGNOSES:  1.DCIS left breast 2. Acquired absence bilateral breasts 3. Skin flap necrosis  POSTOPERATIVE DIAGNOSES:  same  PROCEDURE:  Complex repair chest 3 cm   SURGEON: Irene Limbo MD MBA  ASSISTANT: none  ANESTHESIA:  General.   EBL: minimal  COMPLICATIONS: None immediate.   INDICATIONS FOR PROCEDURE:  The patient, Amanda Hawkins, is a 50 y.o. female born on 1970-12-04, is here for debridement bilateral mastectomy flap necrosis following nipple sparing mastectomies and immediate tissue expander acellular dermis reconstruction.   FINDINGS: Dry eschar right inframammary fold scar and right medial chest. Following debridement each area represented full thickness wound. Left chest superficial slough excised. Saline exchange completed, right initial fill volume 50 ml saline, left fill volume 75 ml saline  DESCRIPTION OF PROCEDURE:  The patient's operative site was marked with the patient in the preoperative area. The patient was taken to the operating room. SCDs were placed and IV antibiotics were given. The patient's operative site was prepped and draped in a sterile fashion. A time out was performed and all information was confirmed to be correct. Over right chest sharp excision with knife of right central portion inframammary fold scar eschar completed. Full thickness wound noted. Over left medial chest, sharp excision dry eschar with knife completed and full thickness wound with exposure acellular dermis in this area noted. Wounds irrigated with saline solution containing Ancef gentamicin and Betadine. Layered closure each area completed with 3-0 monocryl in dermis and 4-0 monocryl simple running skin closure. Total length repair 3 cm. Over left lateral chest, tangential excision slough present completed with knife. Superficial wound noted with no  exposure underlying reconstruction. Simple closure area completed with running 4-0 monocryl length 1 cm. Dermabond applied followed by Tegaderms.  Port identified and accessed. Air removed from each expander and saline used to fill expanders.  The patient was allowed to wake from anesthesia, extubated and taken to the recovery room in satisfactory condition.   SPECIMENS: none  DRAINS: none

## 2020-09-09 NOTE — Anesthesia Postprocedure Evaluation (Signed)
Anesthesia Post Note  Patient: Ramona  Procedure(s) Performed: DEBRIDEMENT BILATERAL MASTECTOMY FLAPS WITH TISSUE EXPANSION (Bilateral: Breast)     Patient location during evaluation: PACU Anesthesia Type: General Level of consciousness: awake and alert Pain management: pain level controlled Vital Signs Assessment: post-procedure vital signs reviewed and stable Respiratory status: spontaneous breathing, nonlabored ventilation and respiratory function stable Cardiovascular status: blood pressure returned to baseline and stable Postop Assessment: no apparent nausea or vomiting Anesthetic complications: no   No notable events documented.  Last Vitals:  Vitals:   09/09/20 0830 09/09/20 0846  BP: 129/83 126/82  Pulse: 92 98  Resp: 18 18  Temp:  36.5 C  SpO2: 99% 100%    Last Pain:  Vitals:   09/09/20 0846  TempSrc:   PainSc: 0-No pain                 Lynda Rainwater

## 2020-09-09 NOTE — Anesthesia Preprocedure Evaluation (Addendum)
Anesthesia Evaluation  Patient identified by MRN, date of birth, ID band Patient awake    Reviewed: Allergy & Precautions, NPO status , Patient's Chart, lab work & pertinent test results  History of Anesthesia Complications Negative for: history of anesthetic complications  Airway Mallampati: II  TM Distance: >3 FB Neck ROM: Full    Dental no notable dental hx. (+) Dental Advisory Given   Pulmonary neg pulmonary ROS,    Pulmonary exam normal        Cardiovascular negative cardio ROS Normal cardiovascular exam     Neuro/Psych negative neurological ROS     GI/Hepatic negative GI ROS, Neg liver ROS,   Endo/Other  Hypothyroidism   Renal/GU negative Renal ROS     Musculoskeletal negative musculoskeletal ROS (+)   Abdominal   Peds  Hematology negative hematology ROS (+)   Anesthesia Other Findings   Reproductive/Obstetrics                             Anesthesia Physical  Anesthesia Plan  ASA: 2  Anesthesia Plan: General   Post-op Pain Management:    Induction: Intravenous  PONV Risk Score and Plan: 3 and Ondansetron, Dexamethasone, Midazolam and Treatment may vary due to age or medical condition  Airway Management Planned: LMA  Additional Equipment:   Intra-op Plan:   Post-operative Plan: Extubation in OR  Informed Consent:   Plan Discussed with: Anesthesiologist  Anesthesia Plan Comments:        Anesthesia Quick Evaluation

## 2020-09-09 NOTE — Anesthesia Procedure Notes (Signed)
Procedure Name: LMA Insertion Date/Time: 09/09/2020 7:22 AM Performed by: Tawni Millers, CRNA Pre-anesthesia Checklist: Patient identified, Emergency Drugs available, Suction available and Patient being monitored Patient Re-evaluated:Patient Re-evaluated prior to induction Oxygen Delivery Method: Circle system utilized Preoxygenation: Pre-oxygenation with 100% oxygen Induction Type: IV induction Ventilation: Mask ventilation without difficulty LMA: LMA inserted LMA Size: 3.0 Number of attempts: 1 Airway Equipment and Method: Bite block Placement Confirmation: positive ETCO2 Tube secured with: Tape Dental Injury: Teeth and Oropharynx as per pre-operative assessment

## 2020-09-09 NOTE — Transfer of Care (Signed)
Immediate Anesthesia Transfer of Care Note  Patient: [place]  Procedure(s) Performed: DEBRIDEMENT BILATERAL MASTECTOMY FLAPS WITH TISSUE EXPANSION (Bilateral: Breast)  Patient Location: PACU  Anesthesia Type:General  Level of Consciousness: awake  Airway & Oxygen Therapy: Patient Spontanous Breathing and Patient connected to face mask oxygen  Post-op Assessment: Report given to RN and Post -op Vital signs reviewed and stable  Post vital signs: Reviewed and stable  Last Vitals:  Vitals Value Taken Time  BP 125/76 09/09/20 0805  Temp    Pulse 106 09/09/20 0806  Resp 18 09/09/20 0806  SpO2 100 % 09/09/20 0806  Vitals shown include unvalidated device data.  Last Pain:  Vitals:   09/09/20 0641  TempSrc: Oral  PainSc: 0-No pain         Complications: No notable events documented.

## 2020-09-09 NOTE — Interval H&P Note (Signed)
History and Physical Interval Note:  09/09/2020 6:49 AM  Amanda Hawkins  has presented today for surgery, with the diagnosis of DCIS LEFT BREAST.  The various methods of treatment have been discussed with the patient and family. After consideration of risks, benefits and other options for treatment, the patient has consented to  Procedure(s): DEBRIDEMENT BILATERAL MASTECTOMY FLAPS (Bilateral) as a surgical intervention.  The patient's history has been reviewed, patient examined, no change in status, stable for surgery.  I have reviewed the patient's chart and labs.  Questions were answered to the patient's satisfaction.     Arnoldo Hooker Renn Dirocco

## 2020-09-09 NOTE — Discharge Instructions (Signed)
  Post Anesthesia Home Care Instructions  Activity: Get plenty of rest for the remainder of the day. A responsible individual must stay with you for 24 hours following the procedure.  For the next 24 hours, DO NOT: -Drive a car -Paediatric nurse -Drink alcoholic beverages -Take any medication unless instructed by your physician -Make any legal decisions or sign important papers. No Tylenol or Ibuprofen until 1:00pm if needed.  Meals: Start with liquid foods such as gelatin or soup. Progress to regular foods as tolerated. Avoid greasy, spicy, heavy foods. If nausea and/or vomiting occur, drink only clear liquids until the nausea and/or vomiting subsides. Call your physician if vomiting continues.  Special Instructions/Symptoms: Your throat may feel dry or sore from the anesthesia or the breathing tube placed in your throat during surgery. If this causes discomfort, gargle with warm salt water. The discomfort should disappear within 24 hours.  If you had a scopolamine patch placed behind your ear for the management of post- operative nausea and/or vomiting:  1. The medication in the patch is effective for 72 hours, after which it should be removed.  Wrap patch in a tissue and discard in the trash. Wash hands thoroughly with soap and water. 2. You may remove the patch earlier than 72 hours if you experience unpleasant side effects which may include dry mouth, dizziness or visual disturbances. 3. Avoid touching the patch. Wash your hands with soap and water after contact with the patch.

## 2020-09-10 ENCOUNTER — Encounter (HOSPITAL_BASED_OUTPATIENT_CLINIC_OR_DEPARTMENT_OTHER): Payer: Self-pay | Admitting: Plastic Surgery

## 2020-09-16 ENCOUNTER — Ambulatory Visit: Payer: No Typology Code available for payment source | Attending: Surgery | Admitting: Physical Therapy

## 2020-09-16 ENCOUNTER — Encounter: Payer: Self-pay | Admitting: Physical Therapy

## 2020-09-16 ENCOUNTER — Other Ambulatory Visit: Payer: Self-pay

## 2020-09-16 DIAGNOSIS — M25611 Stiffness of right shoulder, not elsewhere classified: Secondary | ICD-10-CM | POA: Diagnosis present

## 2020-09-16 DIAGNOSIS — M25612 Stiffness of left shoulder, not elsewhere classified: Secondary | ICD-10-CM | POA: Insufficient documentation

## 2020-09-16 DIAGNOSIS — D0512 Intraductal carcinoma in situ of left breast: Secondary | ICD-10-CM | POA: Diagnosis present

## 2020-09-16 DIAGNOSIS — Z483 Aftercare following surgery for neoplasm: Secondary | ICD-10-CM | POA: Diagnosis present

## 2020-09-16 NOTE — Therapy (Signed)
[place], [place], [address] Phone: [phone]   Fax:  [phone]  Physical Therapy Treatment  Patient Details  Name: Amanda Hawkins MRN: [id number] Date of Birth: 12/31/1970 Referring Provider (PT): Dr. 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   Family history of breast cancer    Hypothyroidism     Past Surgical History:  Procedure Laterality Date   BREAST BIOPSY Left 06/23/2020   MRI   BREAST BIOPSY Right 2017   BREAST RECONSTRUCTION WITH PLACEMENT OF TISSUE EXPANDER AND ALLODERM Bilateral 08/24/2020   Procedure: BILATERAL BREAST RECONSTRUCTION WITH PLACEMENT OF TISSUE EXPANDER AND ALLODERM;  Surgeon: Irene Limbo, MD;  Location: [place];  Service: Plastics;  Laterality: Bilateral;   CESAREAN SECTION     DEBRIDEMENT AND CLOSURE WOUND Bilateral 09/09/2020   Procedure: DEBRIDEMENT BILATERAL MASTECTOMY FLAPS WITH TISSUE EXPANSION;  Surgeon: Irene Limbo, MD;  Location: [place];  Service: Plastics;  Laterality: Bilateral;   NIPPLE SPARING MASTECTOMY Right 08/24/2020   Procedure: RIGHT NIPPLE SPARING MASTECTOMY;  Surgeon: Donnie Mesa, MD;  Location: [place];  Service: General;  Laterality: Right;   NIPPLE SPARING MASTECTOMY WITH SENTINEL LYMPH NODE BIOPSY Left 08/24/2020   Procedure: LEFT NIPPLE SPARING MASTECTOMY WITH SENTINEL LYMPH NODE BIOPSY;  Surgeon: Donnie Mesa, MD;  Location: [place];  Service: General;   Laterality: Left;    There were no vitals filed for this visit.   Subjective Assessment - 09/16/20 1108     Subjective Patient underwent a bilateral mastectomy and left sentinel node biopsy (1 negative node) on 08/24/2020 with immediate reconstruction with expanders placed. She had to have a debridement of bilateral mastectomy flaps on 09/09/2020. Drains were removed 09/08/2020. She does not need any further treatment.    Pertinent History Patient was diagnosed on 06/02/2020 with left low grade DCIS breast cancer. She underwent a bilateral mastectomy and left sentinel node biopsy (1 negative node) on 08/24/2020 with immediate reconstruction. It is ER/PR positive.    Patient Stated Goals See how my arms are doing    Currently in Pain? Yes    Pain Score 2     Pain Location Chest    Pain Orientation Right;Left    Pain Descriptors / Indicators Other (Comment)   pulling   Pain Type Surgical pain    Pain Onset 1 to 4 weeks ago    Pain Frequency Intermittent    Aggravating Factors  Trying to reach overhead    Pain Relieving Factors Rest                [facility] PT Assessment - 09/16/20 0001       Assessment   Medical Diagnosis s/p bil mastectomy with left SLNB    Referring Provider (PT) Dr. Donnie Mesa    Onset Date/Surgical Date 08/24/20    Hand Dominance Left  Prior Therapy Baselines      Precautions   Precautions Other (comment)    Precaution Comments left arm lymphedema risk      Restrictions   Weight Bearing Restrictions No      Balance Screen   Has the patient fallen in the past 6 months No    Has the patient had a decrease in activity level because of a fear of falling?  No    Is the patient reluctant to leave their home because of a fear of falling?  No      Home Environment   Living Environment Private residence    Living Arrangements Spouse/significant other;Children   Husband and 4 kids ages 13-20   Available Help at Discharge Family      Prior Function   Level of  Independence Independent    Vocation Part time employment    Biochemist, clinical    Leisure She has not returned to exercise      Cognition   Overall Cognitive Status Within Functional Limits for tasks assessed      Observation/Other Assessments   Observations Bilateral breast with multiple incisions from drain sites and from reconstruction but all appear to be healing well. Some scabby areas still present and glue still present on right inferior breast incision from recent debridement. No concerning areas noted.      Posture/Postural Control   Posture/Postural Control Postural limitations    Posture Comments slightly rounded shoulders      ROM / Strength   AROM / PROM / Strength AROM      AROM   AROM Assessment Site Shoulder    Right/Left Shoulder Right;Left    Right Shoulder Extension 50 Degrees    Right Shoulder Flexion 122 Degrees    Right Shoulder ABduction 143 Degrees    Right Shoulder Internal Rotation 66 Degrees    Right Shoulder External Rotation 88 Degrees    Left Shoulder Extension 38 Degrees    Left Shoulder Flexion 133 Degrees    Left Shoulder ABduction 142 Degrees    Left Shoulder Internal Rotation 57 Degrees    Left Shoulder External Rotation 83 Degrees      Strength   Overall Strength Unable to assess;Due to precautions               LYMPHEDEMA/ONCOLOGY QUESTIONNAIRE - 09/16/20 0001       Type   Cancer Type Left breast cancer      Surgeries   Mastectomy Date 08/24/20    Sentinel Lymph Node Biopsy Date 08/24/20    Number Lymph Nodes Removed 1      Treatment   Active Chemotherapy Treatment No    Past Chemotherapy Treatment No    Active Radiation Treatment No    Past Radiation Treatment No    Current Hormone Treatment No    Past Hormone Therapy No      What other symptoms do you have   Are you Having Heaviness or Tightness Yes    Are you having Pain Yes    Are you having pitting edema No    Is it Hard or Difficult finding  clothes that fit No    Do you have infections No    Is there Decreased scar mobility Yes    [place] Sign No      Lymphedema Assessments   Lymphedema Assessments Upper extremities      Right Upper Extremity Lymphedema   10 cm Proximal to Olecranon Process 25.5 cm  Olecranon Process 23 cm    10 cm Proximal to Ulnar Styloid Process 20.3 cm    Just Proximal to Ulnar Styloid Process 15.1 cm    Across Hand at [employer] 19.1 cm    At [place] of 2nd Digit 6.1 cm      Left Upper Extremity Lymphedema   10 cm Proximal to Olecranon Process 26.1 cm    Olecranon Process 22.8 cm    10 cm Proximal to Ulnar Styloid Process 20 cm    Just Proximal to Ulnar Styloid Process 15.5 cm    Across Hand at [employer] 19.3 cm    At [place] of 2nd Digit 6.2 cm                Quick Dash - 09/16/20 0001     Open a tight or new jar Moderate difficulty    Do heavy household chores (wash walls, wash floors) Unable    Carry a shopping bag or briefcase Mild difficulty    Wash your back Moderate difficulty    Use a knife to cut food No difficulty    Recreational activities in which you take some force or impact through your arm, shoulder, or hand (golf, hammering, tennis) Unable    During the past week, to what extent has your arm, shoulder or hand problem interfered with your normal social activities with family, friends, neighbors, or groups? Modererately    During the past week, to what extent has your arm, shoulder or hand problem limited your work or other regular daily activities Quite a bit    Arm, shoulder, or hand pain. Mild    Tingling (pins and needles) in your arm, shoulder, or hand None    Difficulty Sleeping Mild difficulty    DASH Score 45.45 %                            PT Education - 09/16/20 1158     Education Details Lymphedema education; HEP; scar massage (selective on certain areas and very gentle)    Person(s) Educated Patient    Methods  Explanation;Demonstration;Handout    Comprehension Returned demonstration;Verbalized understanding                 PT Long Term Goals - 09/16/20 1229       PT LONG TERM GOAL #1   Title Patient will demonstrate she has regained full shoulder ROM and function post operatively compared to baselines.    Time 4    Period Weeks    Status New    Target Date 10/14/20      PT LONG TERM GOAL #2   Title Patient will increase bilateral shoulder flexion ROM to >/= 150 degrees for increased ease reaching overhead.    Time 4    Period Weeks    Status New    Target Date 10/14/20      PT LONG TERM GOAL #3   Title Patient will increase bilateral shoulder abducton ROM to >/=150 degrees for increased ease reaching and doing her hair.    Time 4    Period Weeks    Status New    Target Date 10/14/20      PT LONG TERM GOAL #4   Title Patient will improve her DASH score to be </= 8 for improved overall function.    Time 4    Period Weeks    Status New  Target Date 10/14/20      PT LONG TERM GOAL #5   Title Patient will verbalize good understanding of lymphedema risk reduction practices.    Time 4    Period Weeks    Status New    Target Date 10/14/20                   Plan - 09/16/20 1159     Clinical Impression Statement Patient is doing well s/p bilateral mastectomies and left sentinel node bopsy (1 negative node removed) on 08/24/2020. She is lacking shoulder ROM bilaterally and will benefit from PT for that. She does not need further cancer treatment and will likely have her implant exchange surgery in November. Surgical sites were debrided last week so those are still healing but appear to be doing well.    Stability/Clinical Decision Making Stable/Uncomplicated    Clinical Decision Making Low    Rehab Potential Excellent    PT Frequency 2x / week    PT Duration 4 weeks    PT Treatment/Interventions ADLs/Self Care Home Management;Therapeutic exercise;Patient/family  education;Manual techniques;Passive range of motion;Scar mobilization    PT Next Visit Plan PROM bil shoulders; AAROM exercises to tolerance    PT Home Exercise Plan Post op shoulder ROM HEP    Consulted and Agree with Plan of Care Patient             Patient will benefit from skilled therapeutic intervention in order to improve the following deficits and impairments:  Postural dysfunction, Decreased range of motion, Decreased knowledge of precautions, Impaired UE functional use, Pain  Visit Diagnosis: Ductal carcinoma in situ (DCIS) of left breast - Plan: PT plan of care cert/re-cert  Stiffness of left shoulder, not elsewhere classified - Plan: PT plan of care cert/re-cert  Stiffness of right shoulder, not elsewhere classified - Plan: PT plan of care cert/re-cert  Aftercare following surgery for neoplasm - Plan: PT plan of care cert/re-cert     Problem List Patient Active Problem List   Diagnosis Date Noted   Genetic testing 07/14/2020   Family history of breast cancer 07/07/2020   Breast cancer ([place]) 07/07/2020   Ductal carcinoma in situ (DCIS) of left breast 07/01/2020   Encounter for counseling 11/30/2017   Annia Friendly, PT 09/16/20 12:35 PM   [place], [place], [address] Phone: [phone]   Fax:  [phone]  Name: Amanda Hawkins MRN: [id number] Date of Birth: Jul 25, 1970

## 2020-09-16 NOTE — Patient Instructions (Addendum)
            [facility]         [address], [place] [address]         [phone]         Annia Friendly, PT, CLT   After Breast Cancer Class It is recommended you attend the ABC class to be educated on lymphedema risk reduction. This class is free of charge and lasts for 1 hour. It is a 1-time class.  You are scheduled for October 3 at 11:00. We will end you a Webex link in your email.  Scar massage It is ok to begin gentle scar massage to your incisions (except the one under your right breast that still has glue). You can use coconut oil and very gentle move your skin in various directions a few times each days.  Compression garment: We recommend you wear a compression garment when flying - class I (20-30 mmHg) for the first 2 years.  Home exercise Program Begin doing the exercises I gave you 2x/day to your tolerance.  Follow up PT: It is recommended you return every 3 months for the first 3 years following surgery to be assessed on the SOZO machine for an L-Dex score. This helps prevent clinically significant lymphedema in 95% of patients. These follow up screens are 10 minute appointments that you are not billed for. WE ARE SCHEDULED TO MOVE TO [place] October 18, 2020. APPOINTMENTS FOR SOZO SCREENS AFTER 10/18/2020 WILL BE LOCATED AT [facility] AT [address] [place] [address]. Please call us to confirm we have moved if your appointment is scheduled after October 3rd, 2022. The phone number is [phone]. You are scheduled for November 14th at 9:00.

## 2020-09-29 ENCOUNTER — Encounter: Payer: Self-pay | Admitting: Physical Therapy

## 2020-10-05 ENCOUNTER — Encounter: Payer: Self-pay | Admitting: Physical Therapy

## 2020-10-07 ENCOUNTER — Encounter: Payer: Self-pay | Admitting: Physical Therapy

## 2020-10-12 ENCOUNTER — Encounter: Payer: Self-pay | Admitting: Physical Therapy

## 2020-10-14 ENCOUNTER — Other Ambulatory Visit: Payer: Self-pay

## 2020-10-14 ENCOUNTER — Ambulatory Visit: Payer: No Typology Code available for payment source | Admitting: Physical Therapy

## 2020-10-14 ENCOUNTER — Encounter: Payer: Self-pay | Admitting: Physical Therapy

## 2020-10-14 DIAGNOSIS — D0512 Intraductal carcinoma in situ of left breast: Secondary | ICD-10-CM | POA: Diagnosis not present

## 2020-10-14 DIAGNOSIS — M25611 Stiffness of right shoulder, not elsewhere classified: Secondary | ICD-10-CM

## 2020-10-14 DIAGNOSIS — M25612 Stiffness of left shoulder, not elsewhere classified: Secondary | ICD-10-CM

## 2020-10-14 DIAGNOSIS — Z483 Aftercare following surgery for neoplasm: Secondary | ICD-10-CM

## 2020-10-14 NOTE — Therapy (Signed)
[place], [place], [address] Phone: [phone]   Fax:  [phone]  Physical Therapy Treatment  Patient Details  Name: Amanda Hawkins MRN: [id number] Date of Birth: [birth year] Referring Provider (PT): Dr. Donnie Mesa   Encounter Date: 10/14/2020   PT End of Session - 10/14/20 1117     Visit Number 3    Number of Visits 11    Date for PT Re-Evaluation 11/11/20    PT Start Time 1102    PT Stop Time 1150    PT Time Calculation (min) 48 min    Activity Tolerance Patient tolerated treatment well    Behavior During Therapy [facility] for tasks assessed/performed             Past Medical History:  Diagnosis Date   Breast cancer ([place])    left breast DCIS   Family history of breast cancer    Hypothyroidism     Past Surgical History:  Procedure Laterality Date   BREAST BIOPSY Left 06/23/2020   MRI   BREAST BIOPSY Right 2017   BREAST RECONSTRUCTION WITH PLACEMENT OF TISSUE EXPANDER AND ALLODERM Bilateral 08/24/2020   Procedure: BILATERAL BREAST RECONSTRUCTION WITH PLACEMENT OF TISSUE EXPANDER AND ALLODERM;  Surgeon: Irene Limbo, MD;  Location: [place];  Service: Plastics;  Laterality: Bilateral;   CESAREAN SECTION     DEBRIDEMENT AND CLOSURE WOUND Bilateral 09/09/2020   Procedure: DEBRIDEMENT BILATERAL MASTECTOMY FLAPS WITH TISSUE EXPANSION;  Surgeon: Irene Limbo, MD;  Location: [place];  Service: Plastics;  Laterality: Bilateral;   NIPPLE SPARING MASTECTOMY Right 08/24/2020   Procedure: RIGHT NIPPLE SPARING MASTECTOMY;  Surgeon: Donnie Mesa, MD;  Location: [place];  Service: Hawkins;  Laterality: Right;   NIPPLE SPARING MASTECTOMY WITH SENTINEL LYMPH NODE BIOPSY Left 08/24/2020   Procedure: LEFT NIPPLE SPARING MASTECTOMY WITH SENTINEL LYMPH NODE BIOPSY;  Surgeon: Donnie Mesa, MD;  Location: [place];  Service: Hawkins;   Laterality: Left;    There were no vitals filed for this visit.   Subjective Assessment - 10/14/20 1103     Subjective Right after I saw Inez Catalina my wounds opened and I had to get re stitched. I just had the stitches taken out yesterday. I have not done any exercises since the area opened up.    Pertinent History Patient was diagnosed on 06/02/2020 with left low grade DCIS breast cancer. She underwent a bilateral mastectomy and left sentinel node biopsy (1 negative node) on 08/24/2020 with immediate reconstruction. It is ER/PR positive.    Patient Stated Goals See how my arms are doing    Currently in Pain? Yes    Pain Score 2     Pain Location --   trunk   Pain Orientation Right;Left    Pain Descriptors / Indicators Sharp;Tender   pulling   Pain Type Acute pain    Pain Onset More than a month ago    Pain Frequency Intermittent    Aggravating Factors  movement    Pain Relieving Factors rubbing it    Effect of Pain on Daily Activities no effect but does feel more cautious due to pain                [facility] PT Assessment - 10/14/20 0001       AROM   Right Shoulder Extension 64 Degrees    Right Shoulder Flexion 150 Degrees    Right Shoulder ABduction 163 Degrees  Right Shoulder Internal Rotation 62 Degrees    Right Shoulder External Rotation 82 Degrees    Left Shoulder Extension 49 Degrees    Left Shoulder Flexion 143 Degrees    Left Shoulder ABduction 155 Degrees    Left Shoulder Internal Rotation 48 Degrees    Left Shoulder External Rotation 80 Degrees                   Quick Dash - 10/14/20 0001     Open a tight or new jar Severe difficulty    Do heavy household chores (wash walls, wash floors) Mild difficulty    Carry a shopping bag or briefcase Mild difficulty    Wash your back Severe difficulty    Use a knife to cut food No difficulty    Recreational activities in which you take some force or impact through your arm, shoulder, or hand (golf, hammering,  tennis) Moderate difficulty    During the past week, to what extent has your arm, shoulder or hand problem interfered with your normal social activities with family, friends, neighbors, or groups? Modererately    During the past week, to what extent has your arm, shoulder or hand problem limited your work or other regular daily activities Modererately    Arm, shoulder, or hand pain. Moderate    Tingling (pins and needles) in your arm, shoulder, or hand None    Difficulty Sleeping No difficulty    DASH Score 36.36 %                    [facility] Adult PT Treatment/Exercise - 10/14/20 0001       Manual Therapy   Manual Therapy Passive ROM    Passive ROM to bilateral shoulders in to flexion, abduction and ER while making sure not to put too much tension on healing incisions                          PT Long Term Goals - 10/14/20 1113       PT LONG TERM GOAL #1   Title Patient will demonstrate she has regained full shoulder ROM and function post operatively compared to baselines.    Time 4    Period Weeks    Status On-going      PT LONG TERM GOAL #2   Title Patient will increase bilateral shoulder flexion ROM to >/= 150 degrees for increased ease reaching overhead.    Baseline 10/14/20- R 150, L 143    Time 4    Period Weeks    Status Partially Met      PT LONG TERM GOAL #3   Title Patient will increase bilateral shoulder abducton ROM to >/=150 degrees for increased ease reaching and doing her hair.    Baseline 10/14/20- R 163 L 155    Time 4    Period Weeks    Status Achieved      PT LONG TERM GOAL #4   Title Patient will improve her DASH score to be </= 8 for improved overall function.    Baseline 10/14/20- 36.36    Time 4    Period Weeks      PT LONG TERM GOAL #5   Title Patient will verbalize good understanding of lymphedema risk reduction practices.    Time 4    Period Weeks    Status On-going      Additional Long Term Goals   Additional Long  Term Goals Yes      PT LONG TERM GOAL #6   Title Pt will be independent in a home exercise program for long term stretching and strengthening    Time 4    Period Weeks    Status New    Target Date 11/11/20      PT LONG TERM GOAL #7   Title Pt will report no discomfort at drain sites to allow pt to have improved comfort    Time 4    Period Weeks    Status New    Target Date 11/11/20                   Plan - 10/14/20 1200     Clinical Impression Statement Pt has not been seen over the last month because her incisions opened up and she had to have new stitches placed and her doctor advised her to stop activity to allow it to heal. Assessed pt's progress towards goals in therapy. Pt has now met her abduction ROM goal and is progressing towards her others goals. Added a new goal to address pain at drain site and to instruct pt in a home exercise program. Pt would benefit from skilled PT services to continue to improve ROM, decrease tightness and pain and progress pt towards independence with a home exercise program.    PT Frequency 2x / week    PT Duration 4 weeks    PT Treatment/Interventions ADLs/Self Care Home Management;Therapeutic exercise;Patient/family education;Manual techniques;Passive range of motion;Scar mobilization    PT Next Visit Plan PROM bil shoulders; AAROM exercises to tolerance be mindful of healing incisions esp on R    PT Home Exercise Plan Post op shoulder ROM HEP    Consulted and Agree with Plan of Care Patient             Patient will benefit from skilled therapeutic intervention in order to improve the following deficits and impairments:  Postural dysfunction, Decreased range of motion, Decreased knowledge of precautions, Impaired UE functional use, Pain  Visit Diagnosis: Stiffness of left shoulder, not elsewhere classified  Stiffness of right shoulder, not elsewhere classified  Aftercare following surgery for neoplasm  Ductal carcinoma in situ  (DCIS) of left breast     Problem List Patient Active Problem List   Diagnosis Date Noted   Genetic testing 07/14/2020   Family history of breast cancer 07/07/2020   Breast cancer ([place]) 07/07/2020   Ductal carcinoma in situ (DCIS) of left breast 07/01/2020   Encounter for counseling 11/30/2017    Manus Gunning, PT 10/14/2020, 12:02 PM  [place], [place], [address] Phone: [phone]   Fax:  [phone]  Name: Amanda Hawkins MRN: [id number] Date of Birth: Aug 18, 1970  Manus Gunning, PT 10/14/20 12:02 PM

## 2020-10-21 ENCOUNTER — Ambulatory Visit: Payer: No Typology Code available for payment source | Attending: Surgery

## 2020-10-21 ENCOUNTER — Other Ambulatory Visit: Payer: Self-pay

## 2020-10-21 DIAGNOSIS — M25612 Stiffness of left shoulder, not elsewhere classified: Secondary | ICD-10-CM | POA: Diagnosis not present

## 2020-10-21 DIAGNOSIS — Z483 Aftercare following surgery for neoplasm: Secondary | ICD-10-CM | POA: Insufficient documentation

## 2020-10-21 DIAGNOSIS — D0512 Intraductal carcinoma in situ of left breast: Secondary | ICD-10-CM | POA: Insufficient documentation

## 2020-10-21 DIAGNOSIS — M25611 Stiffness of right shoulder, not elsewhere classified: Secondary | ICD-10-CM | POA: Diagnosis present

## 2020-10-21 NOTE — Therapy (Signed)
[facility] @ [place], [place], [address] Phone:     Fax:     Physical Therapy Treatment  Patient Details  Name: Amanda Hawkins MRN: [id number] Date of Birth: 07-13-1970 Referring Provider (PT): Dr. Donnie Mesa   Encounter Date: 10/21/2020   PT End of Session - 10/21/20 0955     Visit Number 4    Number of Visits 11    Date for PT Re-Evaluation 11/11/20    PT Start Time 0859    PT Stop Time 0954    PT Time Calculation (min) 55 min    Activity Tolerance Patient tolerated treatment well    Behavior During Therapy [facility] for tasks assessed/performed             Past Medical History:  Diagnosis Date   Breast cancer ([place])    left breast DCIS   Family history of breast cancer    Hypothyroidism     Past Surgical History:  Procedure Laterality Date   BREAST BIOPSY Left 06/23/2020   MRI   BREAST BIOPSY Right 2017   BREAST RECONSTRUCTION WITH PLACEMENT OF TISSUE EXPANDER AND ALLODERM Bilateral 08/24/2020   Procedure: BILATERAL BREAST RECONSTRUCTION WITH PLACEMENT OF TISSUE EXPANDER AND ALLODERM;  Surgeon: Irene Limbo, MD;  Location: [facility];  Service: Plastics;  Laterality: Bilateral;   CESAREAN SECTION     DEBRIDEMENT AND CLOSURE WOUND Bilateral 09/09/2020   Procedure: DEBRIDEMENT BILATERAL MASTECTOMY FLAPS WITH TISSUE EXPANSION;  Surgeon: Irene Limbo, MD;  Location: [facility];  Service: Plastics;  Laterality: Bilateral;   NIPPLE SPARING MASTECTOMY Right 08/24/2020   Procedure: RIGHT NIPPLE SPARING MASTECTOMY;  Surgeon: Donnie Mesa, MD;  Location: [facility];  Service: General;  Laterality: Right;   NIPPLE SPARING MASTECTOMY WITH SENTINEL LYMPH NODE BIOPSY Left 08/24/2020   Procedure: LEFT NIPPLE SPARING MASTECTOMY WITH SENTINEL LYMPH NODE BIOPSY;  Surgeon: Donnie Mesa, MD;  Location: [facility];  Service: General;  Laterality: Left;     There were no vitals filed for this visit.   Subjective Assessment - 10/21/20 0859     Subjective A little sore after last visit.  My incisions did OK.  Have been doing exercises at home without any problems.  Where the drains were it hurts and sometimes get a little shooting pain    Pertinent History Patient was diagnosed on 06/02/2020 with left low grade DCIS breast cancer. She underwent a bilateral mastectomy and left sentinel node biopsy (1 negative node) on 08/24/2020 with immediate reconstruction. It is ER/PR positive.    Patient Stated Goals See how my arms are doing    Currently in Pain? No/denies    Pain Score 0-No pain    Multiple Pain Sites No                               [facility] Adult PT Treatment/Exercise - 10/21/20 0001       Shoulder Exercises: Supine   Other Supine Exercises supine wand flexion and scaption x 3 each      Shoulder Exercises: Pulleys   Flexion 2 minutes    Scaption 2 minutes    ABduction 2 minutes      Manual Therapy   Manual Therapy Soft tissue mobilization;Passive ROM    Soft tissue mobilization bilateral pectorals and lats in stargazer position with cocobutter to decrease tightness  Passive ROM to bilateral shoulders in to flexion, abduction and ER while making sure not to put too much tension on healing incisions                          PT Long Term Goals - 10/14/20 1113       PT LONG TERM GOAL #1   Title Patient will demonstrate she has regained full shoulder ROM and function post operatively compared to baselines.    Time 4    Period Weeks    Status On-going      PT LONG TERM GOAL #2   Title Patient will increase bilateral shoulder flexion ROM to >/= 150 degrees for increased ease reaching overhead.    Baseline 10/14/20- R 150, L 143    Time 4    Period Weeks    Status Partially Met      PT LONG TERM GOAL #3   Title Patient will increase bilateral shoulder abducton ROM to >/=150 degrees for  increased ease reaching and doing her hair.    Baseline 10/14/20- R 163 L 155    Time 4    Period Weeks    Status Achieved      PT LONG TERM GOAL #4   Title Patient will improve her DASH score to be </= 8 for improved overall function.    Baseline 10/14/20- 36.36    Time 4    Period Weeks      PT LONG TERM GOAL #5   Title Patient will verbalize good understanding of lymphedema risk reduction practices.    Time 4    Period Weeks    Status On-going      Additional Long Term Goals   Additional Long Term Goals Yes      PT LONG TERM GOAL #6   Title Pt will be independent in a home exercise program for long term stretching and strengthening    Time 4    Period Weeks    Status New    Target Date 11/11/20      PT LONG TERM GOAL #7   Title Pt will report no discomfort at drain sites to allow pt to have improved comfort    Time 4    Period Weeks    Status New    Target Date 11/11/20                   Plan - 10/21/20 0956     Clinical Impression Statement Checked pt incisions with incisions looking very good. pt did exceptionally well with pulleys with a nice gradual increase in ROM. Performed soft tissue work to bilateral pectoralis and lats with significant increased tissue tension in bilateral axillary border of pectoralis. Educated pt tp start scar mobilization to drain sites    Stability/Clinical Decision Making Stable/Uncomplicated    Rehab Potential Excellent    PT Frequency 2x / week    PT Duration 4 weeks    PT Treatment/Interventions ADLs/Self Care Home Management;Therapeutic exercise;Patient/family education;Manual techniques;Passive range of motion;Scar mobilization    PT Next Visit Plan PROM bil shoulders; AAROM exercises to tolerance be mindful of healing incisions esp on R    PT Home Exercise Plan Post op shoulder ROM HEP    Consulted and Agree with Plan of Care Patient             Patient will benefit from skilled therapeutic intervention in order to  improve the following deficits  and impairments:  Postural dysfunction, Decreased range of motion, Decreased knowledge of precautions, Impaired UE functional use, Pain  Visit Diagnosis: Stiffness of left shoulder, not elsewhere classified  Stiffness of right shoulder, not elsewhere classified  Aftercare following surgery for neoplasm  Ductal carcinoma in situ (DCIS) of left breast     Problem List Patient Active Problem List   Diagnosis Date Noted   Genetic testing 07/14/2020   Family history of breast cancer 07/07/2020   Breast cancer ([place]) 07/07/2020   Ductal carcinoma in situ (DCIS) of left breast 07/01/2020   Encounter for counseling 11/30/2017    Claris Pong, PT 10/21/2020, 10:00 AM  [facility] @ [place], [place], [address] Phone:     Fax:     Name: Tenessa Marsee MRN: [id number] Date of Birth: 1970/10/19

## 2020-10-26 ENCOUNTER — Other Ambulatory Visit: Payer: Self-pay

## 2020-10-26 ENCOUNTER — Ambulatory Visit: Payer: No Typology Code available for payment source

## 2020-10-26 DIAGNOSIS — M25612 Stiffness of left shoulder, not elsewhere classified: Secondary | ICD-10-CM | POA: Diagnosis not present

## 2020-10-26 DIAGNOSIS — M25611 Stiffness of right shoulder, not elsewhere classified: Secondary | ICD-10-CM

## 2020-10-26 DIAGNOSIS — D0512 Intraductal carcinoma in situ of left breast: Secondary | ICD-10-CM

## 2020-10-26 DIAGNOSIS — Z483 Aftercare following surgery for neoplasm: Secondary | ICD-10-CM

## 2020-10-26 NOTE — Therapy (Signed)
[facility] @ [place], [place], [address] Phone: [phone]   Fax:  [phone]  Physical Therapy Treatment  Patient Details  Name: Amanda Hawkins MRN: [id number] Date of Birth: 05-31-1970 Referring Provider (PT): Dr. Donnie Mesa   Encounter Date: 10/26/2020   PT End of Session - 10/26/20 0816     Visit Number 5    Number of Visits 11    Date for PT Re-Evaluation 11/11/20    PT Start Time 0801    PT Stop Time 0856    PT Time Calculation (min) 55 min    Activity Tolerance Patient tolerated treatment well    Behavior During Therapy [facility] for tasks assessed/performed             Past Medical History:  Diagnosis Date   Breast cancer ([facility])    left breast DCIS   Family history of breast cancer    Hypothyroidism     Past Surgical History:  Procedure Laterality Date   BREAST BIOPSY Left 06/23/2020   MRI   BREAST BIOPSY Right 2017   BREAST RECONSTRUCTION WITH PLACEMENT OF TISSUE EXPANDER AND ALLODERM Bilateral 08/24/2020   Procedure: BILATERAL BREAST RECONSTRUCTION WITH PLACEMENT OF TISSUE EXPANDER AND ALLODERM;  Surgeon: Irene Limbo, MD;  Location: [facility];  Service: Plastics;  Laterality: Bilateral;   CESAREAN SECTION     DEBRIDEMENT AND CLOSURE WOUND Bilateral 09/09/2020   Procedure: DEBRIDEMENT BILATERAL MASTECTOMY FLAPS WITH TISSUE EXPANSION;  Surgeon: Irene Limbo, MD;  Location: [facility];  Service: Plastics;  Laterality: Bilateral;   NIPPLE SPARING MASTECTOMY Right 08/24/2020   Procedure: RIGHT NIPPLE SPARING MASTECTOMY;  Surgeon: Donnie Mesa, MD;  Location: [facility];  Service: General;  Laterality: Right;   NIPPLE SPARING MASTECTOMY WITH SENTINEL LYMPH NODE BIOPSY Left 08/24/2020   Procedure: LEFT NIPPLE SPARING MASTECTOMY WITH SENTINEL LYMPH NODE BIOPSY;  Surgeon: Donnie Mesa, MD;  Location: [facility];  Service:  General;  Laterality: Left;    There were no vitals filed for this visit.   Subjective Assessment - 10/26/20 0759     Subjective I did well after last visit.  I felt like I loosened up well. I am sore under the right breast and I feel like the expander is digging into me. It feels better if I sit up straight   Pertinent History Patient was diagnosed on 06/02/2020 with left low grade DCIS breast cancer. She underwent a bilateral mastectomy and left sentinel node biopsy (1 negative node) on 08/24/2020 with immediate reconstruction. It is ER/PR positive.    Patient Stated Goals See how my arms are doing    Currently in Pain? No/denies    Pain Score 0-No pain                               [facility] Adult PT Treatment/Exercise - 10/26/20 0001       Shoulder Exercises: Supine   Other Supine Exercises supine AROM flex, scaption, horizontal abd x 5 ea      Shoulder Exercises: Pulleys   Flexion 2 minutes    Scaption 2 minutes    ABduction 2 minutes      Manual Therapy   Manual Therapy Soft tissue mobilization;Passive ROM    Soft tissue mobilization bilateral pectorals and lats in stargazer position with cocobutter to decrease tightness    Passive ROM to bilateral  shoulders in to flexion, abduction, D2 flexion and ER while making sure not to put too much tension on healing incisions                          PT Long Term Goals - 10/14/20 1113       PT LONG TERM GOAL #1   Title Patient will demonstrate she has regained full shoulder ROM and function post operatively compared to baselines.    Time 4    Period Weeks    Status On-going      PT LONG TERM GOAL #2   Title Patient will increase bilateral shoulder flexion ROM to >/= 150 degrees for increased ease reaching overhead.    Baseline 10/14/20- R 150, L 143    Time 4    Period Weeks    Status Partially Met      PT LONG TERM GOAL #3   Title Patient will increase bilateral shoulder abducton ROM to  >/=150 degrees for increased ease reaching and doing her hair.    Baseline 10/14/20- R 163 L 155    Time 4    Period Weeks    Status Achieved      PT LONG TERM GOAL #4   Title Patient will improve her DASH score to be </= 8 for improved overall function.    Baseline 10/14/20- 36.36    Time 4    Period Weeks      PT LONG TERM GOAL #5   Title Patient will verbalize good understanding of lymphedema risk reduction practices.    Time 4    Period Weeks    Status On-going      Additional Long Term Goals   Additional Long Term Goals Yes      PT LONG TERM GOAL #6   Title Pt will be independent in a home exercise program for long term stretching and strengthening    Time 4    Period Weeks    Status New    Target Date 11/11/20      PT LONG TERM GOAL #7   Title Pt will report no discomfort at drain sites to allow pt to have improved comfort    Time 4    Period Weeks    Status New    Target Date 11/11/20                   Plan - 10/26/20 0848     Clinical Impression Statement Pts incisions continue to do well. A/PROM is improving nicely. Pt continues with tightness and tenderness in pecs and lats.    Stability/Clinical Decision Making Stable/Uncomplicated    Rehab Potential Excellent    PT Frequency 2x / week    PT Duration 4 weeks    PT Treatment/Interventions ADLs/Self Care Home Management;Therapeutic exercise;Patient/family education;Manual techniques;Passive range of motion;Scar mobilization    PT Next Visit Plan PROM bil shoulders; AAROM exercises to tolerance be mindful of healing incisions esp on R, AROM    PT Home Exercise Plan Post op shoulder ROM HEP, supine wand flex and scaption    Consulted and Agree with Plan of Care Patient             Patient will benefit from skilled therapeutic intervention in order to improve the following deficits and impairments:  Postural dysfunction, Decreased range of motion, Decreased knowledge of precautions, Impaired UE  functional use, Pain  Visit Diagnosis: Stiffness of left shoulder, not  elsewhere classified  Stiffness of right shoulder, not elsewhere classified  Aftercare following surgery for neoplasm  Ductal carcinoma in situ (DCIS) of left breast     Problem List Patient Active Problem List   Diagnosis Date Noted   Genetic testing 07/14/2020   Family history of breast cancer 07/07/2020   Breast cancer ([facility]) 07/07/2020   Ductal carcinoma in situ (DCIS) of left breast 07/01/2020   Encounter for counseling 11/30/2017    Claris Pong, PT 10/26/2020, 8:58 AM  [facility] @ [place], [place], [address] Phone: [phone]   Fax:  [phone]  Name: Amanda Hawkins MRN: [id number] Date of Birth: 1970-12-11

## 2020-10-26 NOTE — Patient Instructions (Signed)
SHOULDER: Flexion - Supine (Cane)        Cancer Rehab [phone]    Hold cane in both hands. Raise arms up overhead. Do not allow back to arch. Hold _5__ seconds. Do __5-10__ times; __1-2__ times a day. 1. Straight back; hands shoulder width. 2 hands slightly wider apart (V position)  times/day.  Shoulder Blade Stretch    Clasp fingers behind head with elbows touching in front of face. Pull elbows back while pressing shoulder blades together. Relax and hold as tolerated, can place pillow under elbow here for comfort as needed and to allow for prolonged stretch.  Repeat __5__ times. Do __1-2__ sessions per day.     Copyright  VHI. All rights reserved.

## 2020-11-02 ENCOUNTER — Other Ambulatory Visit: Payer: Self-pay

## 2020-11-02 ENCOUNTER — Encounter: Payer: Self-pay | Admitting: Physical Therapy

## 2020-11-02 ENCOUNTER — Ambulatory Visit: Payer: No Typology Code available for payment source | Admitting: Physical Therapy

## 2020-11-02 DIAGNOSIS — M25611 Stiffness of right shoulder, not elsewhere classified: Secondary | ICD-10-CM

## 2020-11-02 DIAGNOSIS — M25612 Stiffness of left shoulder, not elsewhere classified: Secondary | ICD-10-CM | POA: Diagnosis not present

## 2020-11-02 DIAGNOSIS — Z483 Aftercare following surgery for neoplasm: Secondary | ICD-10-CM

## 2020-11-02 DIAGNOSIS — D0512 Intraductal carcinoma in situ of left breast: Secondary | ICD-10-CM

## 2020-11-02 NOTE — Therapy (Signed)
[facility] @ [place], [place], [address] Phone: [phone]   Fax:  [phone]  Physical Therapy Treatment  Patient Details  Name: Amanda Hawkins MRN: [id number] Date of Birth: 12/16/1970 Referring Provider (PT): Dr. Donnie Mesa   Encounter Date: 11/02/2020   PT End of Session - 11/02/20 1553     Visit Number 6    Number of Visits 11    Date for PT Re-Evaluation 11/11/20    PT Start Time 9379    PT Stop Time 1553    PT Time Calculation (min) 47 min    Activity Tolerance Patient tolerated treatment well    Behavior During Therapy [facility] for tasks assessed/performed             Past Medical History:  Diagnosis Date   Breast cancer ([facility])    left breast DCIS   Family history of breast cancer    Hypothyroidism     Past Surgical History:  Procedure Laterality Date   BREAST BIOPSY Left 06/23/2020   MRI   BREAST BIOPSY Right 2017   BREAST RECONSTRUCTION WITH PLACEMENT OF TISSUE EXPANDER AND ALLODERM Bilateral 08/24/2020   Procedure: BILATERAL BREAST RECONSTRUCTION WITH PLACEMENT OF TISSUE EXPANDER AND ALLODERM;  Surgeon: Irene Limbo, MD;  Location: [facility];  Service: Plastics;  Laterality: Bilateral;   CESAREAN SECTION     DEBRIDEMENT AND CLOSURE WOUND Bilateral 09/09/2020   Procedure: DEBRIDEMENT BILATERAL MASTECTOMY FLAPS WITH TISSUE EXPANSION;  Surgeon: Irene Limbo, MD;  Location: [facility];  Service: Plastics;  Laterality: Bilateral;   NIPPLE SPARING MASTECTOMY Right 08/24/2020   Procedure: RIGHT NIPPLE SPARING MASTECTOMY;  Surgeon: Donnie Mesa, MD;  Location: [facility];  Service: General;  Laterality: Right;   NIPPLE SPARING MASTECTOMY WITH SENTINEL LYMPH NODE BIOPSY Left 08/24/2020   Procedure: LEFT NIPPLE SPARING MASTECTOMY WITH SENTINEL LYMPH NODE BIOPSY;  Surgeon: Donnie Mesa, MD;  Location: [facility];  Service:  General;  Laterality: Left;    There were no vitals filed for this visit.   Subjective Assessment - 11/02/20 1508     Subjective I feel pretty good right now. Sometimes it feels better and other times it feels worse.    Pertinent History Patient was diagnosed on 06/02/2020 with left low grade DCIS breast cancer. She underwent a bilateral mastectomy and left sentinel node biopsy (1 negative node) on 08/24/2020 with immediate reconstruction. It is ER/PR positive.    Patient Stated Goals See how my arms are doing    Currently in Pain? No/denies    Pain Score 0-No pain                               [facility] Adult PT Treatment/Exercise - 11/02/20 0001       Shoulder Exercises: Pulleys   Flexion 2 minutes    Scaption 2 minutes    ABduction 2 minutes      Manual Therapy   Soft tissue mobilization bilateral pectorals, serratus and lats in stargazer position to decrease tightness    Passive ROM to bilateral shoulders in to flexion, abduction and ER while making sure not to put too much tension on healing incisions                          PT Long Term Goals - 10/14/20 1113  PT LONG TERM GOAL #1   Title Patient will demonstrate she has regained full shoulder ROM and function post operatively compared to baselines.    Time 4    Period Weeks    Status On-going      PT LONG TERM GOAL #2   Title Patient will increase bilateral shoulder flexion ROM to >/= 150 degrees for increased ease reaching overhead.    Baseline 10/14/20- R 150, L 143    Time 4    Period Weeks    Status Partially Met      PT LONG TERM GOAL #3   Title Patient will increase bilateral shoulder abducton ROM to >/=150 degrees for increased ease reaching and doing her hair.    Baseline 10/14/20- R 163 L 155    Time 4    Period Weeks    Status Achieved      PT LONG TERM GOAL #4   Title Patient will improve her DASH score to be </= 8 for improved overall function.    Baseline  10/14/20- 36.36    Time 4    Period Weeks      PT LONG TERM GOAL #5   Title Patient will verbalize good understanding of lymphedema risk reduction practices.    Time 4    Period Weeks    Status On-going      Additional Long Term Goals   Additional Long Term Goals Yes      PT LONG TERM GOAL #6   Title Pt will be independent in a home exercise program for long term stretching and strengthening    Time 4    Period Weeks    Status New    Target Date 11/11/20      PT LONG TERM GOAL #7   Title Pt will report no discomfort at drain sites to allow pt to have improved comfort    Time 4    Period Weeks    Status New    Target Date 11/11/20                   Plan - 11/02/20 1554     Clinical Impression Statement Pt is demonstrating improving AROM today. She is able to achieve full PROM without much discomfort. Next session will begin to instruct pt in strengthening exercises including supine scap to continue to progress ROM and strength.    PT Frequency 2x / week    PT Duration 4 weeks    PT Treatment/Interventions ADLs/Self Care Home Management;Therapeutic exercise;Patient/family education;Manual techniques;Passive range of motion;Scar mobilization    PT Next Visit Plan give supine scap, PROM bil shoulders; AAROM exercises to tolerance be mindful of healing incisions esp on R, AROM    PT Home Exercise Plan Post op shoulder ROM HEP, supine wand flex and scaption    Consulted and Agree with Plan of Care Patient             Patient will benefit from skilled therapeutic intervention in order to improve the following deficits and impairments:  Postural dysfunction, Decreased range of motion, Decreased knowledge of precautions, Impaired UE functional use, Pain  Visit Diagnosis: Stiffness of left shoulder, not elsewhere classified  Stiffness of right shoulder, not elsewhere classified  Aftercare following surgery for neoplasm  Ductal carcinoma in situ (DCIS) of left  breast     Problem List Patient Active Problem List   Diagnosis Date Noted   Genetic testing 07/14/2020   Family history of breast cancer 07/07/2020  Breast cancer ([place]) 07/07/2020   Ductal carcinoma in situ (DCIS) of left breast 07/01/2020   Encounter for counseling 11/30/2017    Manus Gunning, PT 11/02/2020, 3:56 PM  [facility] @ [place], [place], [address] Phone: [phone]   Fax:  [phone]  Name: Williemae Muriel MRN: [id number] Date of Birth: 1970/09/26  Manus Gunning, PT 11/02/20 3:56 PM

## 2020-11-03 NOTE — H&P (Signed)
Subjective:     Patient ID: Amanda Hawkins is a 50 y.o. female.   HPI   10 weeks post op. 8 weeks post debridement areas mastectomy flap necrosis.  Underwent re closure IMF wound 6 weeks ago and re closure medial right chest flap wound 5 weeks ago. Scheduled for implant exchange next month.   Presented following high risk screening MRI showing indeterminate 0.6 cm oval mass in the central upper left breast. Biopsy showed low grade DCIS, ER/PR+. Patient elected for bilateral mastectomies. Final pathology left breast no residual DCIS, 0/1 SLN.    Mother with breast ca and leukemia history. Patient previously tested negative for BRCA. Updated genetic testing 2022 (CancerNext-Expanded+RNAinsight panel) negative. Mother had from description TRAM based reconstruction at [facility]. Had mesh used in abdomen.    Prior 32 A. Does not desire significant change, "full B". Right mastectomy 93 g Left mastectomy 90 g.   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   Review of Systems   Objective:   Physical Exam Cardiovascular:     Rate and Rhythm: Normal rate and regular rhythm.     Heart sounds: Normal heart sounds.  Pulmonary:     Effort: Pulmonary effort is normal.     Breath sounds: Normal breath sounds.     Chest:  over left chest repair lateral to NAC - intact Right medial chest repair -  intact repair Right IMF repair- intact  No cellulitis bilateral chest expanded soft   SN to nipple R 21 L 21 cm Nipple to IMF R 5 L 5 cm      Assessment:     DCIS left Family history breast ca S/p bilateral NSM, left SLN, bilateral prepectoral TE/ADM (Alloderm) reconstruction S/p debridement bilateral mastectomy flaps    Plan:       Plan removal bilateral chest TE and placement silicone implants. Reviewed saline vs silicone implants. Recommend silicone capacity filled or HCG. Reviewed these may offer less visible rippling over saline. Reviewed rupture rates  and need for imaging surveillance silicone implants, FDA guidelines regarding this. Counseled implants are not permanent devices and will require additional surgery in future.   Discussed fat grafting to aid with contour and limit visible rippling. Reviewed variable take graft, may need to repeat, fat necrosis that can present as masses. Reviewed pain with procedure and need to compression post liposuction. Minimal donor sites for her, if desires would plan medial thighs. Patient declines this for now.    Additional risks including seroma hematoma infection wound healing problems blood clots in legs or lungs reviewed.   Completed [facility] reconstruction physician patient checklist.    Rx for Bactrim given. Patient prefers Tylenol/Motring for pain post op.   Natrelle 133S [id number] T tissue expanders placed bilateral.  right fill volume 150 ml saline  left fill volume 195 ml saline

## 2020-11-09 ENCOUNTER — Encounter: Payer: No Typology Code available for payment source | Admitting: Physical Therapy

## 2020-11-15 ENCOUNTER — Encounter (HOSPITAL_BASED_OUTPATIENT_CLINIC_OR_DEPARTMENT_OTHER): Payer: Self-pay | Admitting: Plastic Surgery

## 2020-11-15 ENCOUNTER — Other Ambulatory Visit: Payer: Self-pay

## 2020-11-17 ENCOUNTER — Other Ambulatory Visit: Payer: Self-pay

## 2020-11-17 ENCOUNTER — Encounter: Payer: Self-pay | Admitting: Physical Therapy

## 2020-11-17 ENCOUNTER — Ambulatory Visit: Payer: No Typology Code available for payment source | Attending: Surgery | Admitting: Physical Therapy

## 2020-11-17 DIAGNOSIS — M25612 Stiffness of left shoulder, not elsewhere classified: Secondary | ICD-10-CM | POA: Insufficient documentation

## 2020-11-17 DIAGNOSIS — D0512 Intraductal carcinoma in situ of left breast: Secondary | ICD-10-CM | POA: Insufficient documentation

## 2020-11-17 DIAGNOSIS — M25611 Stiffness of right shoulder, not elsewhere classified: Secondary | ICD-10-CM | POA: Diagnosis present

## 2020-11-17 DIAGNOSIS — Z483 Aftercare following surgery for neoplasm: Secondary | ICD-10-CM | POA: Insufficient documentation

## 2020-11-17 NOTE — Patient Instructions (Signed)
Over Head Pull: Narrow and Wide Grip   Cancer Rehab [phone]   On back, knees bent, feet flat, band across thighs, elbows straight but relaxed. Pull hands apart (start). Keeping elbows straight, bring arms up and over head, hands toward floor. Keep pull steady on band. Hold momentarily. Return slowly, keeping pull steady, back to start. Then do same with a wider grip on the band (past shoulder width) Repeat _10__ times. Band color __yellow____   Side Pull: Double Arm   On back, knees bent, feet flat. Arms perpendicular to body, shoulder level, elbows straight but relaxed. Pull arms out to sides, elbows straight. Resistance band comes across collarbones, hands toward floor. Hold momentarily. Slowly return to starting position. Repeat _10__ times. Band color _yellow____   Sword   On back, knees bent, feet flat, left hand on left hip, right hand above left. Pull right arm DIAGONALLY (hip to shoulder) across chest. Bring right arm along head toward floor. Thumb pointed down when by hip and rotates upwards when by head. Hold momentarily. Slowly return to starting position. Repeat _10__ times. Do with left arm. Band color _yellow_____   Shoulder Rotation: Double Arm   On back, knees bent, feet flat, elbows tucked at sides, bent 90, hands palms up. Pull hands apart and down toward floor, keeping elbows near sides. Hold momentarily. Slowly return to starting position. Repeat _10__ times. Band color __yellow____

## 2020-11-17 NOTE — Therapy (Addendum)
[facility] [place], [place], [address] Phone: [phone]   Fax:  [phone]  Physical Therapy Treatment  Patient Details  Name: Amanda Hawkins MRN: [id number] Date of Birth: July 29, 1970 Referring Provider (PT): Dr. Donnie Mesa   Encounter Date: 11/17/2020   PT End of Session - 11/17/20 1101     Visit Number 7    Number of Visits 15    Date for PT Re-Evaluation 12/15/20    PT Start Time 1003    PT Stop Time 1052    PT Time Calculation (min) 49 min    Activity Tolerance Patient tolerated treatment well    Behavior During Therapy [facility] for tasks assessed/performed             Past Medical History:  Diagnosis Date   Breast cancer ([place])    left breast DCIS   Family history of breast cancer    Hypothyroidism     Past Surgical History:  Procedure Laterality Date   BREAST BIOPSY Left 06/23/2020   MRI   BREAST BIOPSY Right 2017   BREAST RECONSTRUCTION WITH PLACEMENT OF TISSUE EXPANDER AND ALLODERM Bilateral 08/24/2020   Procedure: BILATERAL BREAST RECONSTRUCTION WITH PLACEMENT OF TISSUE EXPANDER AND ALLODERM;  Surgeon: Irene Limbo, MD;  Location: [facility];  Service: Plastics;  Laterality: Bilateral;   CESAREAN SECTION     DEBRIDEMENT AND CLOSURE WOUND Bilateral 09/09/2020   Procedure: DEBRIDEMENT BILATERAL MASTECTOMY FLAPS WITH TISSUE EXPANSION;  Surgeon: Irene Limbo, MD;  Location: [facility];  Service: Plastics;  Laterality: Bilateral;   NIPPLE SPARING MASTECTOMY Right 08/24/2020   Procedure: RIGHT NIPPLE SPARING MASTECTOMY;  Surgeon: Donnie Mesa, MD;  Location: [facility];  Service: General;  Laterality: Right;   NIPPLE SPARING MASTECTOMY WITH SENTINEL LYMPH NODE BIOPSY Left 08/24/2020   Procedure: LEFT NIPPLE SPARING MASTECTOMY WITH SENTINEL LYMPH NODE BIOPSY;  Surgeon: Donnie Mesa, MD;  Location: [facility];  Service: General;   Laterality: Left;    There were no vitals filed for this visit.   Subjective Assessment - 11/17/20 1003     Subjective I am feeling pretty good. I am not having as much trouble with my movement. I have been doing my exercises.    Pertinent History Patient was diagnosed on 06/02/2020 with left low grade DCIS breast cancer. She underwent a bilateral mastectomy and left sentinel node biopsy (1 negative node) on 08/24/2020 with immediate reconstruction. It is ER/PR positive.    Patient Stated Goals See how my arms are doing    Currently in Pain? No/denies    Pain Score 0-No pain                [facility] PT Assessment - 11/17/20 0001       AROM   Right Shoulder Extension 49 Degrees    Right Shoulder Flexion 170 Degrees    Right Shoulder ABduction 174 Degrees    Right Shoulder Internal Rotation 55 Degrees    Right Shoulder External Rotation 89 Degrees    Left Shoulder Extension 46 Degrees    Left Shoulder Flexion 159 Degrees    Left Shoulder ABduction 165 Degrees    Left Shoulder Internal Rotation 62 Degrees    Left Shoulder External Rotation 85 Degrees                L-DEX FLOWSHEETS - 11/17/20 1000       L-DEX LYMPHEDEMA SCREENING   Measurement  Type Unilateral    L-DEX MEASUREMENT EXTREMITY Upper Extremity    POSITION  Standing    DOMINANT SIDE Left    At Risk Side Left    BASELINE SCORE (UNILATERAL) -7.5    L-DEX SCORE (UNILATERAL) -4.1    VALUE CHANGE (UNILAT) 3.4               Quick Dash - 11/17/20 0001     Open a tight or new jar Mild difficulty    Do heavy household chores (wash walls, wash floors) Mild difficulty    Carry a shopping bag or briefcase No difficulty    Wash your back Mild difficulty    Use a knife to cut food No difficulty    Recreational activities in which you take some force or impact through your arm, shoulder, or hand (golf, hammering, tennis) Mild difficulty    During the past week, to what extent has your arm, shoulder or hand  problem interfered with your normal social activities with family, friends, neighbors, or groups? Slightly    During the past week, to what extent has your arm, shoulder or hand problem limited your work or other regular daily activities Slightly    Arm, shoulder, or hand pain. Mild    Tingling (pins and needles) in your arm, shoulder, or hand None    Difficulty Sleeping No difficulty    DASH Score 15.91 %                    [facility] Adult PT Treatment/Exercise - 11/17/20 0001       Shoulder Exercises: Supine   Horizontal ABduction Strengthening;Both;10 reps   pt returned therapist demo   Theraband Level (Shoulder Horizontal ABduction) Level 1 (Yellow)    External Rotation Strengthening;Both;10 reps;Theraband   pt returned therapist demo   Theraband Level (Shoulder External Rotation) Level 1 (Yellow)    Flexion Strengthening;Both;10 reps;Theraband   pt returned therapist demo; narrow and wide grip   Theraband Level (Shoulder Flexion) Level 1 (Yellow)    Diagonals Strengthening;Both;10 reps;Theraband    Theraband Level (Shoulder Diagonals) Level 1 (Yellow)                          PT Long Term Goals - 11/17/20 1005       PT LONG TERM GOAL #1   Title Patient will demonstrate she has regained full shoulder ROM and function post operatively compared to baselines.    Time 4    Period Weeks    Status On-going      PT LONG TERM GOAL #2   Title Patient will increase bilateral shoulder flexion ROM to >/= 150 degrees for increased ease reaching overhead.    Baseline 10/14/20- R 150, L 143; 11/17/20- R 170 L 159    Time 4    Period Weeks    Status Achieved      PT LONG TERM GOAL #3   Title Patient will increase bilateral shoulder abducton ROM to >/=150 degrees for increased ease reaching and doing her hair.    Baseline 10/14/20- R 163 L 155; 11/17/20- L 159, R 165    Time 4    Period Weeks    Status Achieved      PT LONG TERM GOAL #4   Title Patient will improve  her DASH score to be </= 8 for improved overall function.    Baseline 10/14/20- 36.36; 11/17/20- 15    Time 4  Period Weeks    Status On-going      PT LONG TERM GOAL #5   Title Patient will verbalize good understanding of lymphedema risk reduction practices.    Baseline 11/17/20- pt instructed today and issued handout    Time 4    Period Weeks    Status Achieved      PT LONG TERM GOAL #6   Title Pt will be independent in a home exercise program for long term stretching and strengthening    Time 4    Period Weeks    Status On-going      PT LONG TERM GOAL #7   Title Pt will report no discomfort at drain sites to allow pt to have improved comfort    Baseline 11/17/20- pt is still having discomfort at her drain sites but reports it is improving    Time 4    Period Weeks    Status On-going                   Plan - 11/17/20 1057     Clinical Impression Statement Assessed pt's progress towards goals in therapy today. She has met her ROM goals but her L shoulder is still more limited and more tight than her R shoulder. Pt has been doing ROM exercises at home. Educated pt today in strengthening exercises using yellow theraband. Reassessed her SOZO score today and she has increased 3.4 from baseline which is less than 6.5 so she does not demonstrate any signs of subclinical lymphedema. Pt is having her implant exchange surgery next Tuesday and will have to ask her doctor when she is cleared to return to PT. Pt would benefit from additional skilled PT services to improve L shoulder ROM, decrease tightness and instruct pt in a home exercise that she can progress at home for strengthening and stretching.    PT Frequency 2x / week    PT Duration 4 weeks    PT Treatment/Interventions ADLs/Self Care Home Management;Therapeutic exercise;Patient/family education;Manual techniques;Passive range of motion;Scar mobilization;Vasopneumatic Device    PT Next Visit Plan reassess ROM post surgery,  indep with supine scap? eventually teach strength ABC PROM bil shoulders; AAROM exercises to tolerance be mindful of healing incisions esp on R, AROM    PT Home Exercise Plan Post op shoulder ROM HEP, supine wand flex and scaption, supine scap    Consulted and Agree with Plan of Care Patient             Patient will benefit from skilled therapeutic intervention in order to improve the following deficits and impairments:  Postural dysfunction, Decreased range of motion, Decreased knowledge of precautions, Impaired UE functional use, Pain  Visit Diagnosis: Stiffness of left shoulder, not elsewhere classified  Stiffness of right shoulder, not elsewhere classified  Aftercare following surgery for neoplasm  Ductal carcinoma in situ (DCIS) of left breast     Problem List Patient Active Problem List   Diagnosis Date Noted   Genetic testing 07/14/2020   Family history of breast cancer 07/07/2020   Breast cancer ([place]) 07/07/2020   Ductal carcinoma in situ (DCIS) of left breast 07/01/2020   Encounter for counseling 11/30/2017    Manus Gunning, PT 11/17/2020, 11:03 AM  [facility] [place], [place], [address] Phone: [phone]   Fax:  [phone]  Name: Amanda Hawkins MRN: [id number] Date of Birth: July 17, 1970   Manus Gunning, PT 11/17/20 11:03 AM  PHYSICAL THERAPY DISCHARGE SUMMARY  Visits from Start of Care: 7  Current functional level related to goals / functional outcomes: Goals partially met - see above   Remaining deficits: See above   Education / Equipment: HEP   Patient agrees to discharge. Patient goals were partially met. Patient is being discharged due to not returning since the last visit.  Allyson Sabal [place], [place] 05/02/21 11:59 AM

## 2020-11-22 NOTE — Anesthesia Preprocedure Evaluation (Addendum)
Anesthesia Evaluation  Patient identified by MRN, date of birth, ID band Patient awake    Reviewed: Allergy & Precautions, NPO status , Patient's Chart, lab work & pertinent test results  Airway Mallampati: II  TM Distance: >3 FB Neck ROM: Full    Dental no notable dental hx. (+) Teeth Intact, Dental Advisory Given   Pulmonary neg pulmonary ROS,    Pulmonary exam normal breath sounds clear to auscultation       Cardiovascular Exercise Tolerance: Good Normal cardiovascular exam Rhythm:Regular Rate:Normal     Neuro/Psych negative neurological ROS  negative psych ROS   GI/Hepatic negative GI ROS, Neg liver ROS,   Endo/Other  Hypothyroidism   Renal/GU negative Renal ROS     Musculoskeletal negative musculoskeletal ROS (+)   Abdominal   Peds  Hematology negative hematology ROS (+)   Anesthesia Other Findings   Reproductive/Obstetrics                            Anesthesia Physical Anesthesia Plan  ASA: 2  Anesthesia Plan: General   Post-op Pain Management:    Induction: Intravenous  PONV Risk Score and Plan: 4 or greater and Midazolam, Dexamethasone and Ondansetron  Airway Management Planned: Oral ETT  Additional Equipment: None  Intra-op Plan:   Post-operative Plan: Extubation in OR  Informed Consent: I have reviewed the patients History and Physical, chart, labs and discussed the procedure including the risks, benefits and alternatives for the proposed anesthesia with the patient or authorized representative who has indicated his/her understanding and acceptance.     Dental advisory given  Plan Discussed with: CRNA and Anesthesiologist  Anesthesia Plan Comments:        Anesthesia Quick Evaluation

## 2020-11-23 ENCOUNTER — Other Ambulatory Visit: Payer: Self-pay

## 2020-11-23 ENCOUNTER — Ambulatory Visit (HOSPITAL_BASED_OUTPATIENT_CLINIC_OR_DEPARTMENT_OTHER)
Admission: RE | Admit: 2020-11-23 | Discharge: 2020-11-23 | Disposition: A | Discharge status: Home / Self Care | Payer: No Typology Code available for payment source | Attending: Plastic Surgery | Admitting: Plastic Surgery

## 2020-11-23 ENCOUNTER — Encounter (HOSPITAL_BASED_OUTPATIENT_CLINIC_OR_DEPARTMENT_OTHER)
Admission: RE | Disposition: A | Discharge status: Home / Self Care | Payer: Self-pay | Source: Home / Self Care | Attending: Plastic Surgery

## 2020-11-23 ENCOUNTER — Ambulatory Visit (HOSPITAL_BASED_OUTPATIENT_CLINIC_OR_DEPARTMENT_OTHER): Payer: No Typology Code available for payment source | Admitting: Anesthesiology

## 2020-11-23 ENCOUNTER — Encounter (HOSPITAL_BASED_OUTPATIENT_CLINIC_OR_DEPARTMENT_OTHER): Payer: Self-pay | Admitting: Plastic Surgery

## 2020-11-23 DIAGNOSIS — Z45811 Encounter for adjustment or removal of right breast implant: Secondary | ICD-10-CM | POA: Insufficient documentation

## 2020-11-23 DIAGNOSIS — Z803 Family history of malignant neoplasm of breast: Secondary | ICD-10-CM | POA: Diagnosis not present

## 2020-11-23 DIAGNOSIS — Z806 Family history of leukemia: Secondary | ICD-10-CM | POA: Diagnosis not present

## 2020-11-23 DIAGNOSIS — Z9889 Other specified postprocedural states: Secondary | ICD-10-CM | POA: Diagnosis not present

## 2020-11-23 DIAGNOSIS — E039 Hypothyroidism, unspecified: Secondary | ICD-10-CM | POA: Diagnosis not present

## 2020-11-23 DIAGNOSIS — Z45812 Encounter for adjustment or removal of left breast implant: Secondary | ICD-10-CM | POA: Insufficient documentation

## 2020-11-23 DIAGNOSIS — Z9013 Acquired absence of bilateral breasts and nipples: Secondary | ICD-10-CM | POA: Insufficient documentation

## 2020-11-23 HISTORY — PX: REMOVAL OF BILATERAL TISSUE EXPANDERS WITH PLACEMENT OF BILATERAL BREAST IMPLANTS: SHX6431

## 2020-11-23 LAB — POCT PREGNANCY, URINE: Preg Test, Ur: NEGATIVE

## 2020-11-23 SURGERY — REMOVAL, TISSUE EXPANDER, BREAST, BILATERAL, WITH BILATERAL IMPLANT IMPLANT INSERTION
Anesthesia: General | Site: Chest | Laterality: Bilateral

## 2020-11-23 MED ORDER — HYDROMORPHONE HCL 1 MG/ML IJ SOLN
0.2500 mg | INTRAMUSCULAR | Status: DC | PRN
  Administered 2020-11-23: 0.5 mg via INTRAVENOUS

## 2020-11-23 MED ORDER — ACETAMINOPHEN 500 MG PO TABS
1000.0000 mg | ORAL_TABLET | ORAL | Status: AC
  Administered 2020-11-23: 1000 mg via ORAL

## 2020-11-23 MED ORDER — ONDANSETRON HCL 4 MG/2ML IJ SOLN
INTRAMUSCULAR | Status: DC | PRN
  Administered 2020-11-23: 4 mg via INTRAVENOUS

## 2020-11-23 MED ORDER — HYDROMORPHONE HCL 1 MG/ML IJ SOLN
INTRAMUSCULAR | Status: AC
  Filled 2020-11-23: qty 0.5

## 2020-11-23 MED ORDER — AMISULPRIDE (ANTIEMETIC) 5 MG/2ML IV SOLN: 10.0000 mg | Freq: Once | INTRAVENOUS | Status: DC | PRN

## 2020-11-23 MED ORDER — ONDANSETRON HCL 4 MG/2ML IJ SOLN
INTRAMUSCULAR | Status: AC
  Filled 2020-11-23: qty 2

## 2020-11-23 MED ORDER — FENTANYL CITRATE (PF) 100 MCG/2ML IJ SOLN
INTRAMUSCULAR | Status: DC | PRN
  Administered 2020-11-23: 100 ug via INTRAVENOUS

## 2020-11-23 MED ORDER — SODIUM CHLORIDE 0.9 % IV SOLN
INTRAVENOUS | Status: DC | PRN
  Administered 2020-11-23: 500 mL

## 2020-11-23 MED ORDER — ONDANSETRON HCL 4 MG/2ML IJ SOLN: 4.0000 mg | Freq: Once | INTRAMUSCULAR | Status: DC | PRN

## 2020-11-23 MED ORDER — GABAPENTIN 300 MG PO CAPS
ORAL_CAPSULE | ORAL | Status: AC
  Filled 2020-11-23: qty 1

## 2020-11-23 MED ORDER — PROPOFOL 10 MG/ML IV BOLUS
INTRAVENOUS | Status: DC | PRN
  Administered 2020-11-23: 100 mg via INTRAVENOUS

## 2020-11-23 MED ORDER — VANCOMYCIN HCL 500 MG IV SOLR
INTRAVENOUS | Status: AC
  Filled 2020-11-23: qty 20

## 2020-11-23 MED ORDER — CHLORHEXIDINE GLUCONATE CLOTH 2 % EX PADS: 6.0000 | MEDICATED_PAD | Freq: Once | CUTANEOUS | Status: DC

## 2020-11-23 MED ORDER — GABAPENTIN 300 MG PO CAPS
300.0000 mg | ORAL_CAPSULE | ORAL | Status: AC
  Administered 2020-11-23: 300 mg via ORAL

## 2020-11-23 MED ORDER — ACETAMINOPHEN 500 MG PO TABS
ORAL_TABLET | ORAL | Status: AC
  Filled 2020-11-23: qty 2

## 2020-11-23 MED ORDER — OXYCODONE HCL 5 MG PO TABS
ORAL_TABLET | ORAL | Status: AC
  Filled 2020-11-23: qty 1

## 2020-11-23 MED ORDER — MIDAZOLAM HCL 5 MG/5ML IJ SOLN
INTRAMUSCULAR | Status: DC | PRN
  Administered 2020-11-23: 2 mg via INTRAVENOUS

## 2020-11-23 MED ORDER — CELECOXIB 200 MG PO CAPS
200.0000 mg | ORAL_CAPSULE | ORAL | Status: AC
  Administered 2020-11-23: 200 mg via ORAL

## 2020-11-23 MED ORDER — LIDOCAINE 2% (20 MG/ML) 5 ML SYRINGE
INTRAMUSCULAR | Status: AC
  Filled 2020-11-23: qty 5

## 2020-11-23 MED ORDER — LACTATED RINGERS IV SOLN: INTRAVENOUS | Status: DC

## 2020-11-23 MED ORDER — PROPOFOL 500 MG/50ML IV EMUL
INTRAVENOUS | Status: AC
  Filled 2020-11-23: qty 50

## 2020-11-23 MED ORDER — LIDOCAINE-EPINEPHRINE 1 %-1:100000 IJ SOLN
INTRAMUSCULAR | Status: AC
  Filled 2020-11-23: qty 1

## 2020-11-23 MED ORDER — SODIUM CHLORIDE 0.9 % IV SOLN
INTRAVENOUS | Status: AC
  Filled 2020-11-23: qty 10

## 2020-11-23 MED ORDER — BUPIVACAINE HCL (PF) 0.5 % IJ SOLN
INTRAMUSCULAR | Status: AC
  Filled 2020-11-23: qty 60

## 2020-11-23 MED ORDER — SCOPOLAMINE 1 MG/3DAYS TD PT72
MEDICATED_PATCH | TRANSDERMAL | Status: AC
  Filled 2020-11-23: qty 1

## 2020-11-23 MED ORDER — ATROPINE SULFATE 0.4 MG/ML IV SOLN
INTRAVENOUS | Status: AC
  Filled 2020-11-23: qty 1

## 2020-11-23 MED ORDER — DEXMEDETOMIDINE (PRECEDEX) IN NS 20 MCG/5ML (4 MCG/ML) IV SYRINGE
PREFILLED_SYRINGE | INTRAVENOUS | Status: AC
  Filled 2020-11-23: qty 5

## 2020-11-23 MED ORDER — EPHEDRINE 5 MG/ML INJ
INTRAVENOUS | Status: AC
  Filled 2020-11-23: qty 5

## 2020-11-23 MED ORDER — SCOPOLAMINE 1 MG/3DAYS TD PT72
1.0000 | MEDICATED_PATCH | TRANSDERMAL | Status: DC
  Administered 2020-11-23: 1.5 mg via TRANSDERMAL

## 2020-11-23 MED ORDER — MIDAZOLAM HCL 2 MG/2ML IJ SOLN
INTRAMUSCULAR | Status: AC
  Filled 2020-11-23: qty 2

## 2020-11-23 MED ORDER — OXYCODONE HCL 5 MG/5ML PO SOLN: 5.0000 mg | Freq: Once | ORAL | Status: AC | PRN

## 2020-11-23 MED ORDER — EPHEDRINE SULFATE 50 MG/ML IJ SOLN
INTRAMUSCULAR | Status: DC | PRN
  Administered 2020-11-23: 10 mg via INTRAVENOUS

## 2020-11-23 MED ORDER — OXYCODONE HCL 5 MG PO TABS
5.0000 mg | ORAL_TABLET | Freq: Once | ORAL | Status: AC | PRN
  Administered 2020-11-23: 5 mg via ORAL

## 2020-11-23 MED ORDER — SUCCINYLCHOLINE CHLORIDE 200 MG/10ML IV SOSY
PREFILLED_SYRINGE | INTRAVENOUS | Status: AC
  Filled 2020-11-23: qty 10

## 2020-11-23 MED ORDER — DEXAMETHASONE SODIUM PHOSPHATE 4 MG/ML IJ SOLN
INTRAMUSCULAR | Status: DC | PRN
  Administered 2020-11-23: 10 mg via INTRAVENOUS

## 2020-11-23 MED ORDER — BUPIVACAINE HCL (PF) 0.5 % IJ SOLN
INTRAMUSCULAR | Status: DC | PRN
  Administered 2020-11-23: 30 mL

## 2020-11-23 MED ORDER — CELECOXIB 200 MG PO CAPS
ORAL_CAPSULE | ORAL | Status: AC
  Filled 2020-11-23: qty 1

## 2020-11-23 MED ORDER — BUPIVACAINE-EPINEPHRINE (PF) 0.25% -1:200000 IJ SOLN
INTRAMUSCULAR | Status: AC
  Filled 2020-11-23: qty 60

## 2020-11-23 MED ORDER — FENTANYL CITRATE (PF) 100 MCG/2ML IJ SOLN
INTRAMUSCULAR | Status: AC
  Filled 2020-11-23: qty 2

## 2020-11-23 MED ORDER — DEXAMETHASONE SODIUM PHOSPHATE 10 MG/ML IJ SOLN
INTRAMUSCULAR | Status: AC
  Filled 2020-11-23: qty 1

## 2020-11-23 MED ORDER — CEFAZOLIN SODIUM-DEXTROSE 2-4 GM/100ML-% IV SOLN
2.0000 g | INTRAVENOUS | Status: AC
  Administered 2020-11-23: 2 g via INTRAVENOUS

## 2020-11-23 MED ORDER — ACETAMINOPHEN 10 MG/ML IV SOLN: 1000.0000 mg | Freq: Once | INTRAVENOUS | Status: DC | PRN

## 2020-11-23 MED ORDER — DEXMEDETOMIDINE (PRECEDEX) IN NS 20 MCG/5ML (4 MCG/ML) IV SYRINGE
PREFILLED_SYRINGE | INTRAVENOUS | Status: DC | PRN
  Administered 2020-11-23: 8 ug via INTRAVENOUS

## 2020-11-23 MED ORDER — PHENYLEPHRINE 40 MCG/ML (10ML) SYRINGE FOR IV PUSH (FOR BLOOD PRESSURE SUPPORT)
PREFILLED_SYRINGE | INTRAVENOUS | Status: AC
  Filled 2020-11-23: qty 10

## 2020-11-23 MED ORDER — OXYCODONE HCL 5 MG PO TABS: 5.0000 mg | ORAL_TABLET | ORAL | 0 refills | Status: AC | PRN

## 2020-11-23 MED ORDER — LIDOCAINE HCL (CARDIAC) PF 100 MG/5ML IV SOSY
PREFILLED_SYRINGE | INTRAVENOUS | Status: DC | PRN
  Administered 2020-11-23: 80 mg via INTRAVENOUS

## 2020-11-23 MED ORDER — CEFAZOLIN SODIUM-DEXTROSE 2-4 GM/100ML-% IV SOLN
INTRAVENOUS | Status: AC
  Filled 2020-11-23: qty 100

## 2020-11-23 SURGICAL SUPPLY — 82 items
ADH SKN CLS APL DERMABOND .7 (GAUZE/BANDAGES/DRESSINGS) ×2
APL PRP STRL LF DISP 70% ISPRP (MISCELLANEOUS) ×1
BAG DECANTER FOR FLEXI CONT (MISCELLANEOUS) ×2 IMPLANT
BINDER ABDOMINAL 10 UNV 27-48 (MISCELLANEOUS) IMPLANT
BINDER ABDOMINAL 12 SM 30-45 (SOFTGOODS) IMPLANT
BINDER BREAST 3XL (GAUZE/BANDAGES/DRESSINGS) IMPLANT
BINDER BREAST LRG (GAUZE/BANDAGES/DRESSINGS) IMPLANT
BINDER BREAST MEDIUM (GAUZE/BANDAGES/DRESSINGS) ×2 IMPLANT
BINDER BREAST XLRG (GAUZE/BANDAGES/DRESSINGS) IMPLANT
BINDER BREAST XXLRG (GAUZE/BANDAGES/DRESSINGS) IMPLANT
BLADE SURG 10 STRL SS (BLADE) ×4 IMPLANT
BLADE SURG 11 STRL SS (BLADE) ×2 IMPLANT
BNDG GAUZE ELAST 4 BULKY (GAUZE/BANDAGES/DRESSINGS) ×4 IMPLANT
CANISTER LIPO FAT HARVEST (MISCELLANEOUS) IMPLANT
CANISTER SUCT 1200ML W/VALVE (MISCELLANEOUS) ×2 IMPLANT
CHLORAPREP W/TINT 26 (MISCELLANEOUS) ×2 IMPLANT
COVER BACK TABLE 60X90IN (DRAPES) ×2 IMPLANT
COVER MAYO STAND STRL (DRAPES) ×2 IMPLANT
DECANTER SPIKE VIAL GLASS SM (MISCELLANEOUS) IMPLANT
DERMABOND ADVANCED (GAUZE/BANDAGES/DRESSINGS) ×2
DERMABOND ADVANCED .7 DNX12 (GAUZE/BANDAGES/DRESSINGS) ×2 IMPLANT
DRAIN CHANNEL 15F RND FF W/TCR (WOUND CARE) IMPLANT
DRAPE TOP ARMCOVERS (MISCELLANEOUS) ×2 IMPLANT
DRAPE U-SHAPE 76X120 STRL (DRAPES) ×2 IMPLANT
DRAPE UTILITY XL STRL (DRAPES) ×4 IMPLANT
DRSG PAD ABDOMINAL 8X10 ST (GAUZE/BANDAGES/DRESSINGS) ×6 IMPLANT
ELECT BLADE 4.0 EZ CLEAN MEGAD (MISCELLANEOUS)
ELECT COATED BLADE 2.86 ST (ELECTRODE) ×2 IMPLANT
ELECT REM PT RETURN 9FT ADLT (ELECTROSURGICAL) ×2
ELECTRODE BLDE 4.0 EZ CLN MEGD (MISCELLANEOUS) IMPLANT
ELECTRODE REM PT RTRN 9FT ADLT (ELECTROSURGICAL) ×1 IMPLANT
EVACUATOR SILICONE 100CC (DRAIN) IMPLANT
EXTRACTOR CANIST REVOLVE STRL (CANNISTER) IMPLANT
GLOVE SURG HYDRASOFT LTX SZ5.5 (GLOVE) ×2 IMPLANT
GLOVE SURG UNDER POLY LF SZ7 (GLOVE) ×2 IMPLANT
GOWN STRL REUS W/ TWL LRG LVL3 (GOWN DISPOSABLE) ×2 IMPLANT
GOWN STRL REUS W/TWL LRG LVL3 (GOWN DISPOSABLE) ×4
IMPL BREAST GEL 255CC (Breast) ×2 IMPLANT
IMPLANT BREAST GEL 255CC (Breast) ×4 IMPLANT
KIT FILL SYSTEM UNIVERSAL (SET/KITS/TRAYS/PACK) IMPLANT
LINER CANISTER 1000CC FLEX (MISCELLANEOUS) IMPLANT
MARKER SKIN DUAL TIP RULER LAB (MISCELLANEOUS) IMPLANT
NDL SAFETY ECLIPSE 18X1.5 (NEEDLE) ×1 IMPLANT
NEEDLE FILTER BLUNT 18X 1/2SAF (NEEDLE)
NEEDLE FILTER BLUNT 18X1 1/2 (NEEDLE) IMPLANT
NEEDLE HYPO 18GX1.5 SHARP (NEEDLE) ×2
NEEDLE HYPO 25X1 1.5 SAFETY (NEEDLE) ×2 IMPLANT
NS IRRIG 1000ML POUR BTL (IV SOLUTION) ×2 IMPLANT
PACK BASIN DAY SURGERY FS (CUSTOM PROCEDURE TRAY) ×2 IMPLANT
PAD ALCOHOL SWAB (MISCELLANEOUS) IMPLANT
PENCIL SMOKE EVACUATOR (MISCELLANEOUS) ×2 IMPLANT
PIN SAFETY STERILE (MISCELLANEOUS) IMPLANT
SHEET MEDIUM DRAPE 40X70 STRL (DRAPES) ×4 IMPLANT
SIZER BREAST 240CC (SIZER) ×2
SIZER BREAST 255CC (SIZER) ×2
SIZER BRST MDRT STRL LF 240CC (SIZER) ×1 IMPLANT
SIZER BRST MDRT STRL LF 255CC (SIZER) ×1 IMPLANT
SLEEVE SCD COMPRESS KNEE MED (STOCKING) ×2 IMPLANT
SPONGE T-LAP 18X18 ~~LOC~~+RFID (SPONGE) ×4 IMPLANT
STAPLER VISISTAT 35W (STAPLE) ×2 IMPLANT
SUT ETHILON 2 0 FS 18 (SUTURE) IMPLANT
SUT MNCRL AB 4-0 PS2 18 (SUTURE) ×2 IMPLANT
SUT PDS AB 2-0 CT2 27 (SUTURE) IMPLANT
SUT VIC AB 3-0 PS1 18 (SUTURE)
SUT VIC AB 3-0 PS1 18XBRD (SUTURE) IMPLANT
SUT VIC AB 3-0 SH 27 (SUTURE) ×4
SUT VIC AB 3-0 SH 27X BRD (SUTURE) ×2 IMPLANT
SUT VICRYL 4-0 PS2 18IN ABS (SUTURE) ×2 IMPLANT
SYR 10ML LL (SYRINGE) IMPLANT
SYR 20ML LL LF (SYRINGE) IMPLANT
SYR 50ML LL SCALE MARK (SYRINGE) IMPLANT
SYR BULB IRRIG 60ML STRL (SYRINGE) ×2 IMPLANT
SYR CONTROL 10ML LL (SYRINGE) ×2 IMPLANT
SYR TB 1ML LL NO SAFETY (SYRINGE) IMPLANT
SYR TOOMEY IRRIG 70ML (MISCELLANEOUS)
SYRINGE TOOMEY IRRIG 70ML (MISCELLANEOUS) IMPLANT
TOWEL GREEN STERILE FF (TOWEL DISPOSABLE) ×4 IMPLANT
TUBE CONNECTING 20X1/4 (TUBING) ×2 IMPLANT
TUBING INFILTRATION IT-10001 (TUBING) IMPLANT
TUBING SET GRADUATE ASPIR 12FT (MISCELLANEOUS) IMPLANT
UNDERPAD 30X36 HEAVY ABSORB (UNDERPADS AND DIAPERS) ×4 IMPLANT
YANKAUER SUCT BULB TIP NO VENT (SUCTIONS) ×2 IMPLANT

## 2020-11-23 NOTE — Anesthesia Procedure Notes (Signed)
Procedure Name: LMA Insertion Date/Time: 11/23/2020 7:42 AM Performed by: Willa Frater, CRNA Pre-anesthesia Checklist: Patient identified, Emergency Drugs available, Suction available and Patient being monitored Patient Re-evaluated:Patient Re-evaluated prior to induction Oxygen Delivery Method: Circle system utilized Preoxygenation: Pre-oxygenation with 100% oxygen Induction Type: IV induction Ventilation: Mask ventilation without difficulty LMA: LMA inserted LMA Size: 3.0 Number of attempts: 1 Airway Equipment and Method: Bite block Placement Confirmation: positive ETCO2 Tube secured with: Tape Dental Injury: Teeth and Oropharynx as per pre-operative assessment

## 2020-11-23 NOTE — Interval H&P Note (Signed)
History and Physical Interval Note:  11/23/2020 6:42 AM  Amanda Hawkins  has presented today for surgery, with the diagnosis of history DCIS, acquired absence breasts.  The various methods of treatment have been discussed with the patient and family. After consideration of risks, benefits and other options for treatment, the patient has consented to  Procedure(s): REMOVAL OF BILATERAL TISSUE EXPANDERS WITH PLACEMENT OF BILATERAL BREAST IMPLANTS (Bilateral) POSSIBLE LIPOFILLING FROM THIGHS TO BILATERAL CHEST (Bilateral) as a surgical intervention.  The patient's history has been reviewed, patient examined, no change in status, stable for surgery.  I have reviewed the patient's chart and labs.  Questions were answered to the patient's satisfaction.     Arnoldo Hooker [place]

## 2020-11-23 NOTE — Op Note (Signed)
Operative Note   DATE OF OPERATION: 11.8.22  LOCATION: [facility]-outpatient  SURGICAL DIVISION: Plastic Surgery  PREOPERATIVE DIAGNOSES:  1. History DCIS 2. Acquired absence breasts  POSTOPERATIVE DIAGNOSES:  same  PROCEDURE:  Removal bilateral chest tissue expanders and placement silicone implants  SURGEON: Irene Limbo MD MBA  ASSISTANT: none  ANESTHESIA:  General.   EBL: 25 ml  COMPLICATIONS: None immediate.   INDICATIONS FOR PROCEDURE:  The patient, Amanda Hawkins, is a 50 y.o. female born on [birth year], is here for staged breast reconstruction following bilateral nipple sparing mastectomies with immediate tissue expander acellular dermis reconstruction.   FINDINGS: Complete incorporation ADM noted bilateral. Natrelle Soft Touch Moderate Profile Smooth Round 255 ml implants placed bilateral. REF SSM-255 RIGHT SN [id number] LEFT SN [id number]  DESCRIPTION OF PROCEDURE:  The patient's operative site was marked in the preoperative area. The patient was taken to the operating room. SCDs were placed and IV antibiotics were given. The patient's operative site was prepped and draped in a sterile fashion. A time out was performed and all information was confirmed to be correct. I began on left side. Incision made through prior inframammary fold scar and carried through superficial fascia to acellular demis. ADM incised. Expander removed. Full incorporation ADM noted. Sizer placed.   I then directed attention to right chest. Incision made in prior inframammary fold scar and implant cavity entered in similar manner. Expander removed and well incorporated ADM noted. Sizer placed. Patient brought to upright sitting position. Natrelle Smooth Round Moderate Projection 255 ml implant selected for bilateral placement. Patient returned to supine position.   Each cavity irrigated with saline solution containing Betadine, Ancef, gentamicin solution. Hemostasis ensured.The implant was placed in  left chest and implant orientation ensured. Closure completed with 3-0 vicryl to close superficial fascia and ADM over implant. 4-0 vicryl used to close dermis followed by 4-0 monocryl subcuticular. Implant placed in right chest cavity. Closure completed in similar fashion. Dermabond applied to chest incisions. Dry dressing applied, followed by breast binder.  The patient was allowed to wake from anesthesia, extubated and taken to the recovery room in satisfactory condition.   SPECIMENS: none  DRAINS: none

## 2020-11-23 NOTE — Discharge Instructions (Addendum)
Next dose of Tylenol can be given at 1:00pm if needed. Next dose of NSAID (Ibuprofen/Motrin/Aleve) can be given at 3:00pm if needed.   Post Anesthesia Home Care Instructions  Activity: Get plenty of rest for the remainder of the day. A responsible individual must stay with you for 24 hours following the procedure.  For the next 24 hours, DO NOT: -Drive a car -Paediatric nurse -Drink alcoholic beverages -Take any medication unless instructed by your physician -Make any legal decisions or sign important papers.  Meals: Start with liquid foods such as gelatin or soup. Progress to regular foods as tolerated. Avoid greasy, spicy, heavy foods. If nausea and/or vomiting occur, drink only clear liquids until the nausea and/or vomiting subsides. Call your physician if vomiting continues.  Special Instructions/Symptoms: Your throat may feel dry or sore from the anesthesia or the breathing tube placed in your throat during surgery. If this causes discomfort, gargle with warm salt water. The discomfort should disappear within 24 hours.  If you had a scopolamine patch placed behind your ear for the management of post- operative nausea and/or vomiting:  1. The medication in the patch is effective for 72 hours, after which it should be removed.  Wrap patch in a tissue and discard in the trash. Wash hands thoroughly with soap and water. 2. You may remove the patch earlier than 72 hours if you experience unpleasant side effects which may include dry mouth, dizziness or visual disturbances. 3. Avoid touching the patch. Wash your hands with soap and water after contact with the patch.

## 2020-11-23 NOTE — Transfer of Care (Signed)
Immediate Anesthesia Transfer of Care Note  Patient: Amanda Hawkins  Procedure(s) Performed: REMOVAL OF BILATERAL TISSUE EXPANDERS WITH PLACEMENT OF BILATERAL BREAST IMPLANTS (Bilateral: Chest)  Patient Location: PACU  Anesthesia Type:General  Level of Consciousness: awake, alert , oriented, drowsy and patient cooperative  Airway & Oxygen Therapy: Patient Spontanous Breathing and Patient connected to face mask oxygen  Post-op Assessment: Report given to RN and Post -op Vital signs reviewed and stable  Post vital signs: Reviewed and stable  Last Vitals:  Vitals Value Taken Time  BP 130/90 11/23/20 0906  Temp    Pulse 112 11/23/20 0907  Resp    SpO2 100 % 11/23/20 0907  Vitals shown include unvalidated device data.  Last Pain:  Vitals:   11/23/20 0640  TempSrc: Oral  PainSc: 0-No pain         Complications: No notable events documented.

## 2020-11-23 NOTE — Anesthesia Postprocedure Evaluation (Signed)
Anesthesia Post Note  Patient: Amanda Hawkins  Procedure(s) Performed: REMOVAL OF BILATERAL TISSUE EXPANDERS WITH PLACEMENT OF BILATERAL BREAST IMPLANTS (Bilateral: Chest)     Patient location during evaluation: PACU Anesthesia Type: General Level of consciousness: awake and alert Pain management: pain level controlled Vital Signs Assessment: post-procedure vital signs reviewed and stable Respiratory status: spontaneous breathing, nonlabored ventilation, respiratory function stable and patient connected to nasal cannula oxygen Cardiovascular status: blood pressure returned to baseline and stable Postop Assessment: no apparent nausea or vomiting Anesthetic complications: no   No notable events documented.  Last Vitals:  Vitals:   11/23/20 0915 11/23/20 0930  BP: 122/78 124/87  Pulse: 91 95  Resp: 13 15  Temp:    SpO2: 99% 100%    Last Pain:  Vitals:   11/23/20 0926  TempSrc:   PainSc: 3                  Barnet Glasgow

## 2020-11-24 ENCOUNTER — Encounter (HOSPITAL_BASED_OUTPATIENT_CLINIC_OR_DEPARTMENT_OTHER): Payer: Self-pay | Admitting: Plastic Surgery

## 2020-11-29 ENCOUNTER — Ambulatory Visit: Payer: Self-pay

## 2021-02-28 ENCOUNTER — Ambulatory Visit: Payer: No Typology Code available for payment source

## 2021-09-24 IMAGING — US US BREAST*L* LIMITED INC AXILLA
1 series · 1 of 1 positions shown · non-contrast
Comparison: Previous exam(s).

CLINICAL DATA: Patient with recent breast MRI demonstrating a 6 mm
mass within the central left breast, for second-look ultrasound.

EXAM:
ULTRASOUND OF THE LEFT BREAST

[Series 1: us breast*left* limited inc axilla · 0.06mm/px · 1 of 1 slices shown]
[im 1/1]
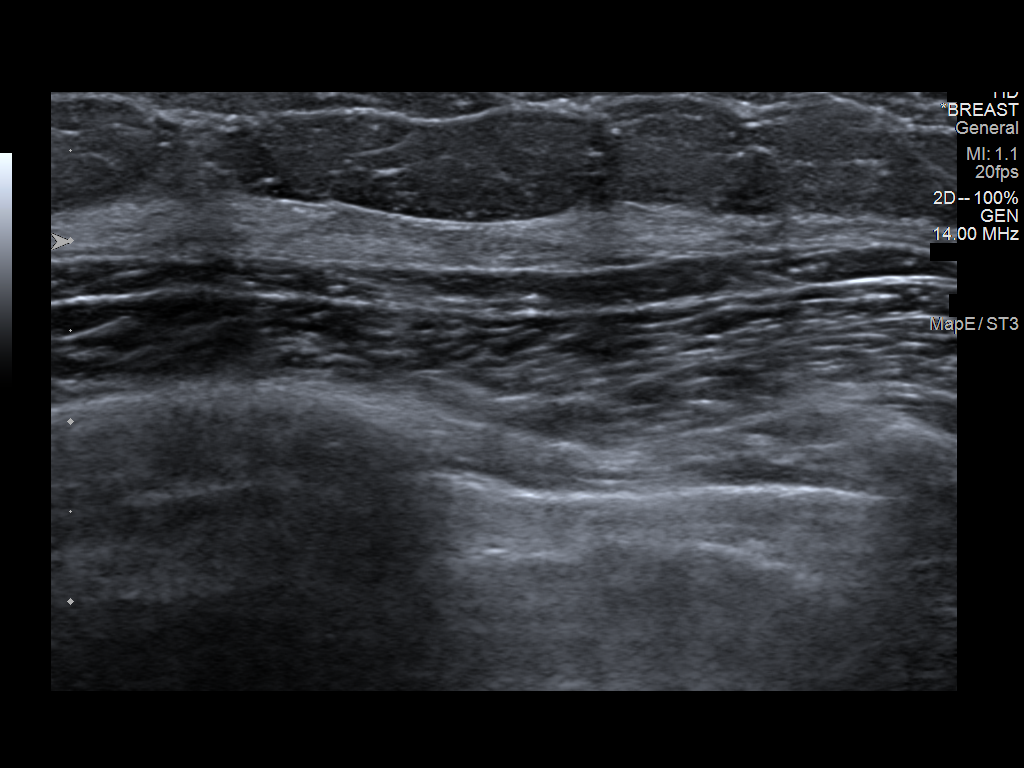

[1 of 1 positions shown; findings below may reference images not displayed]

FINDINGS: On physical exam, no discrete mass palpated superior left breast.

Targeted ultrasound is performed, showing normal tissue without
suspicious mass within the superior left breast. No discrete mass
identified to correspond with the mass identified on recent MRI.
IMPRESSION: No sonographic correlate for the mass identified on recent MRI.

RECOMMENDATION:
MRI guided core needle biopsy of the mass within the left breast.

I have discussed the findings and recommendations with the patient.
If applicable, a reminder letter will be sent to the patient
regarding the next appointment.

BI-RADS CATEGORY  2: Benign.

## 2021-10-10 IMAGING — MG DIGITAL DIAGNOSTIC BILAT W/ TOMO W/ CAD
8 series · 8 of 24 positions shown · non-contrast
Comparison: Previous exams including breast MRI dated 06/02/2020,
MRI guided biopsy dated 06/23/2020 and post clip mammogram dated
06/23/2020, and most recent bilateral screening mammogram dated
12/01/2019.

CLINICAL DATA: Recently diagnosed LEFT breast cancer via MRI guided
biopsy. Ordering physician requests a current mammogram.

EXAM:
DIGITAL DIAGNOSTIC BILATERAL MAMMOGRAM WITH TOMOSYNTHESIS AND CAD
TECHNIQUE: Bilateral digital diagnostic mammography and breast tomosynthesis
was performed. The images were evaluated with computer-aided
detection.

[R MLO synth-2D]
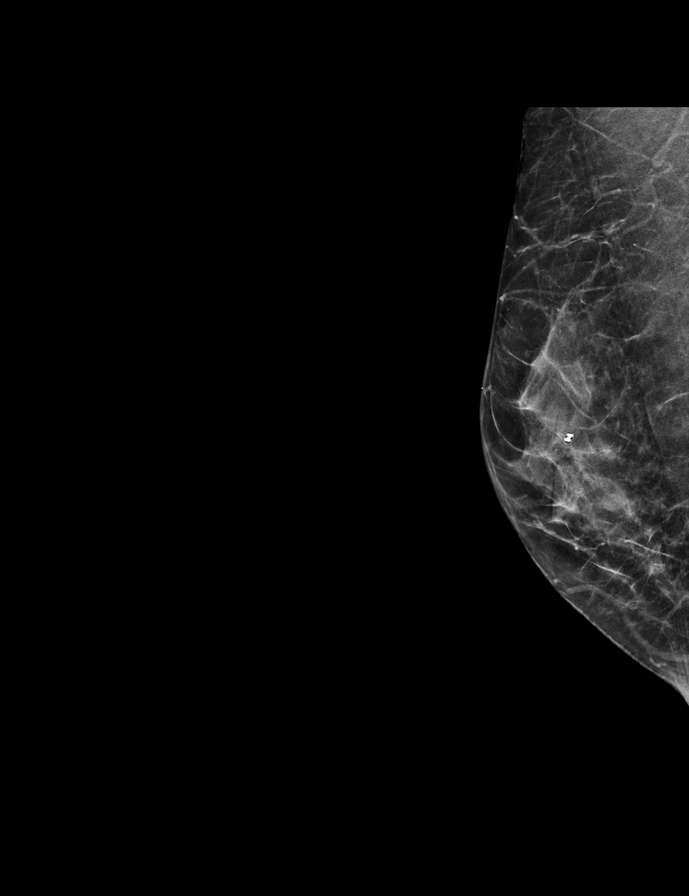

[R CC synth-2D]
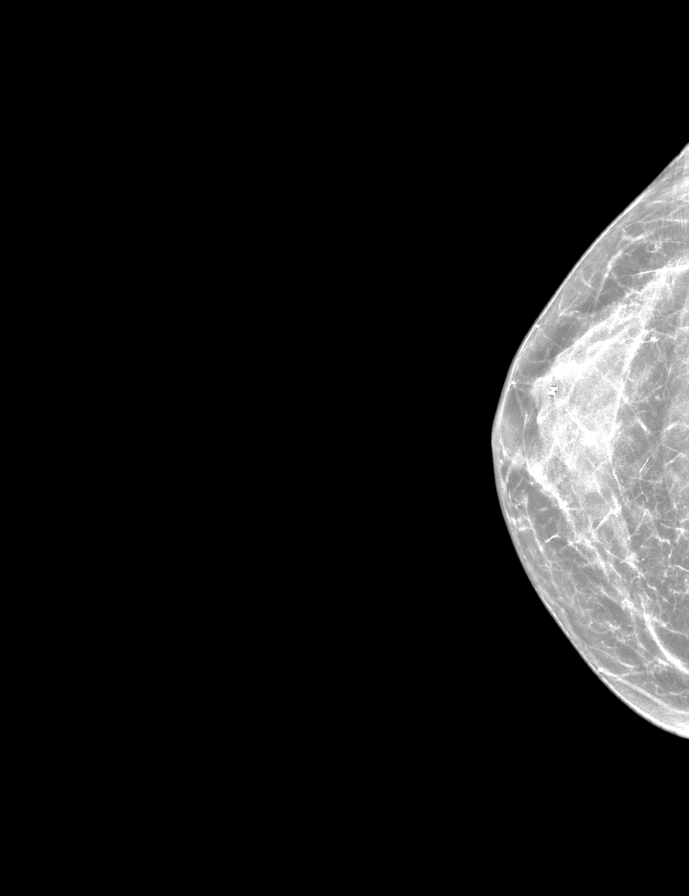

[L MLO synth-2D]
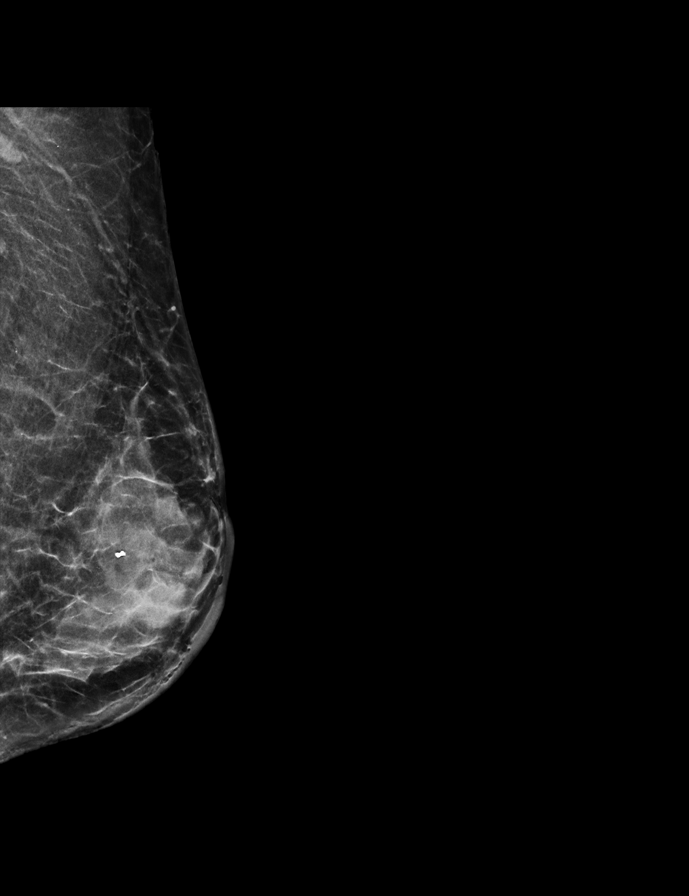

[L CC synth-2D]
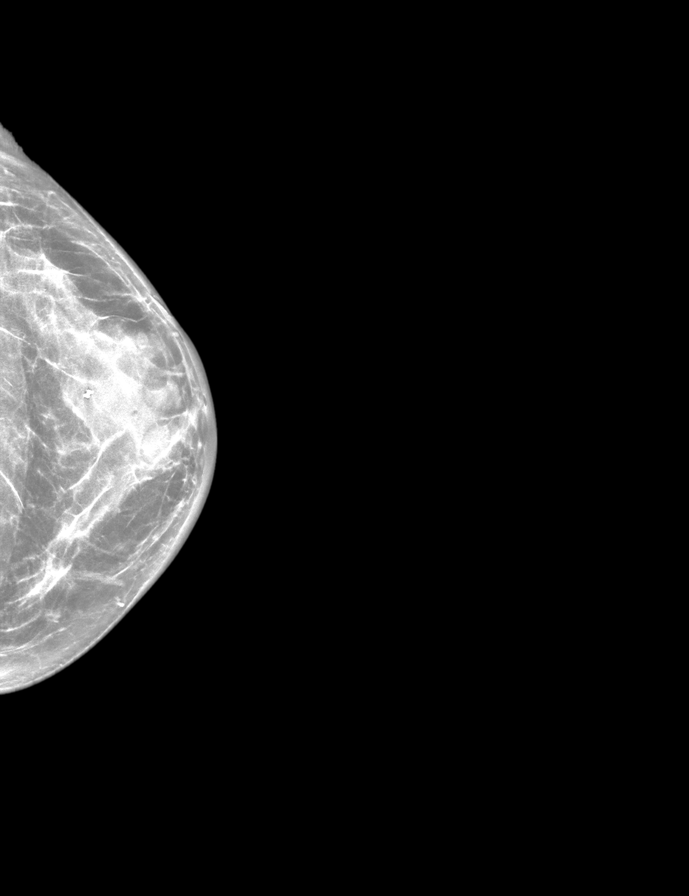

[L MLO tomo · tomo slice 27/52.0]
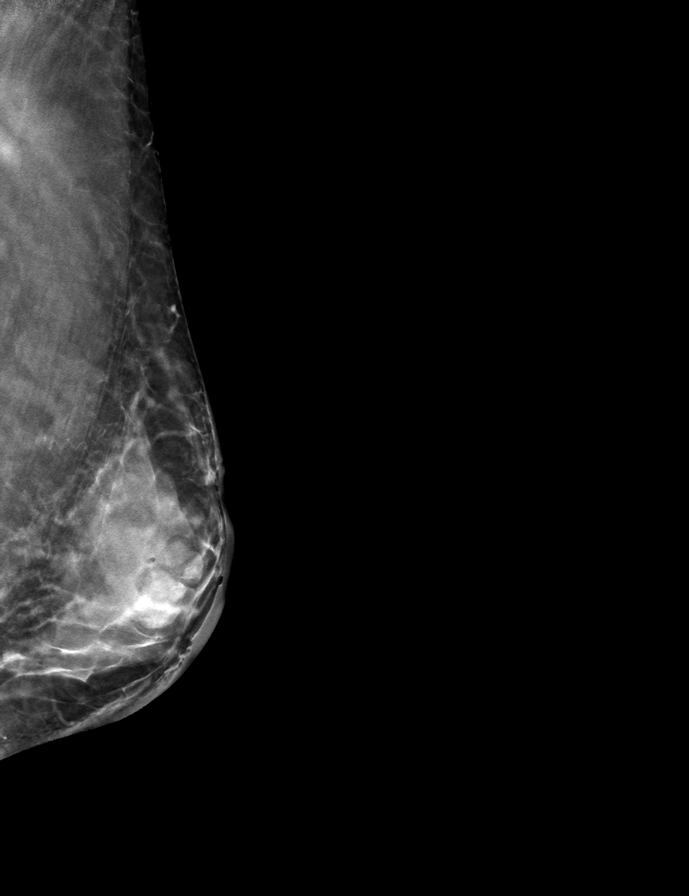

[L CC tomo · tomo slice 26/51.0]
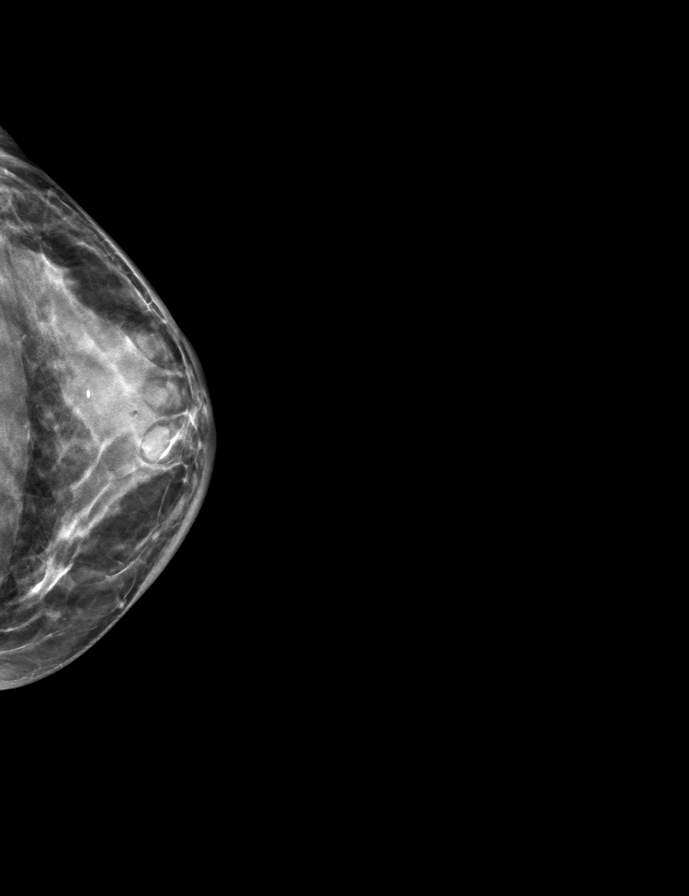

[R MLO tomo · tomo slice 22/43.0]
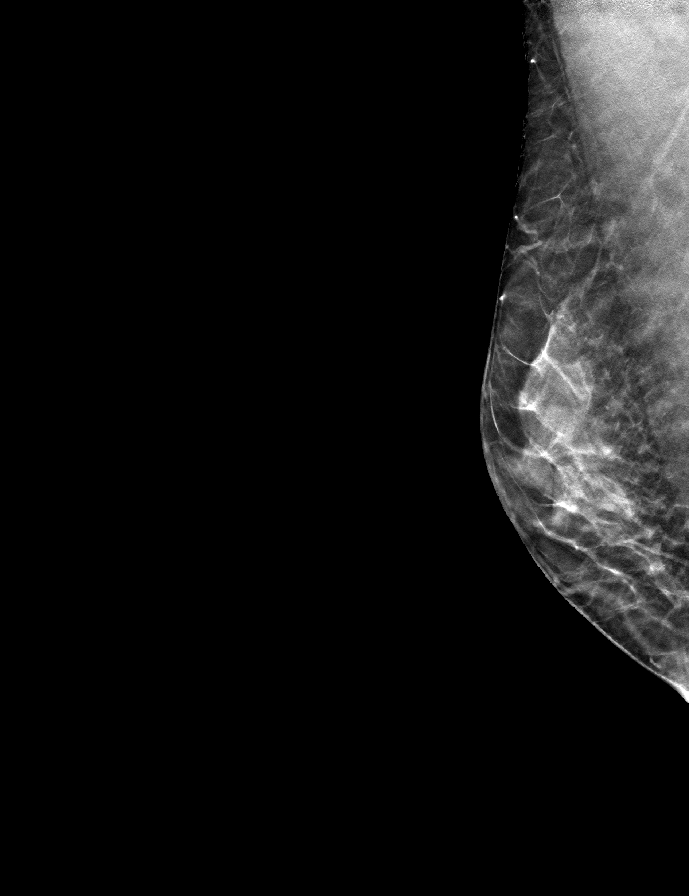

[R CC tomo · tomo slice 21/40.0]
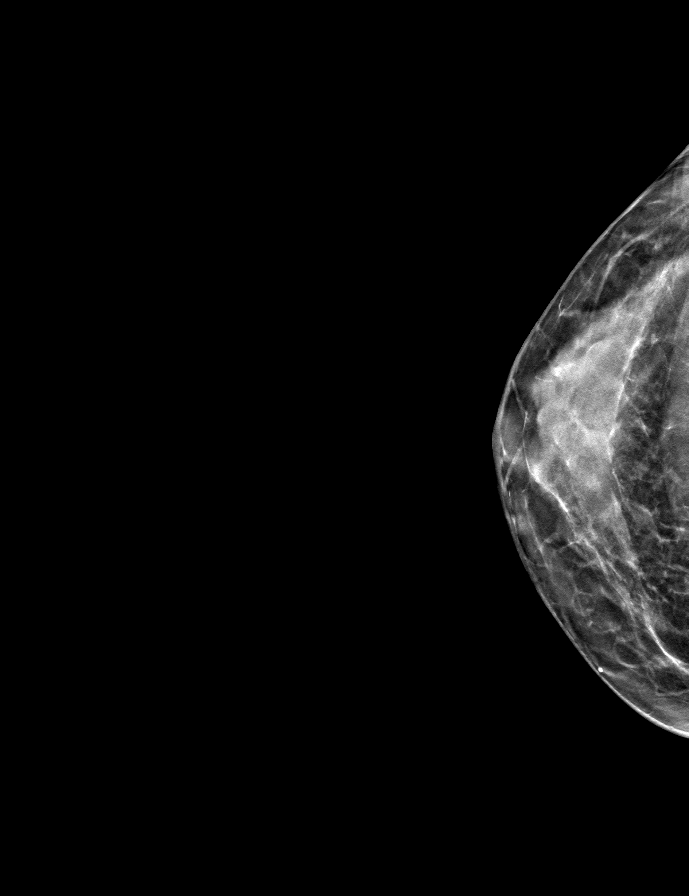

[8 of 24 positions shown; findings below may reference images not displayed]

ACR Breast Density Category c: The breast tissue is heterogeneously
dense, which may obscure small masses.
FINDINGS: Bilateral diagnostic mammogram:

RIGHT breast: There are no new dominant masses, suspicious
calcifications or secondary signs of malignancy within the RIGHT
breast. Biopsy clip within the RIGHT breast corresponds to a
previous benign biopsy site.

LEFT breast: New biopsy clip within the retroareolar to slightly
outer LEFT breast, corresponding to the recent biopsy-proven DCIS.
There is a prominent post biopsy hematoma at and anterior to the
biopsy site.

There are no new dominant masses, suspicious calcifications or
secondary signs of malignancy elsewhere within the LEFT breast.
IMPRESSION: 1. New biopsy clip within the retroareolar to slightly outer LEFT
breast, corresponding to recent biopsy-proven DCIS.
2. Prominent post biopsy hematoma at and anterior to the biopsy
site.
3. No evidence of malignancy within the RIGHT breast.

RECOMMENDATION:
Per treatment plan for patient's known LEFT breast DCIS.

I have discussed the findings and recommendations with the patient.
If applicable, a reminder letter will be sent to the patient
regarding the next appointment.

BI-RADS CATEGORY  6: Known biopsy-proven malignancy.

## 2022-11-17 ENCOUNTER — Ambulatory Visit (INDEPENDENT_AMBULATORY_CARE_PROVIDER_SITE_OTHER): Payer: No Typology Code available for payment source | Admitting: Audiology

## 2022-11-17 ENCOUNTER — Encounter (INDEPENDENT_AMBULATORY_CARE_PROVIDER_SITE_OTHER): Payer: Self-pay

## 2022-11-17 ENCOUNTER — Ambulatory Visit (INDEPENDENT_AMBULATORY_CARE_PROVIDER_SITE_OTHER): Payer: No Typology Code available for payment source | Admitting: Otolaryngology

## 2022-11-17 VITALS — Ht 65.0 in | Wt 135.0 lb

## 2022-11-17 DIAGNOSIS — H900 Conductive hearing loss, bilateral: Secondary | ICD-10-CM | POA: Insufficient documentation

## 2022-11-17 DIAGNOSIS — Z0110 Encounter for examination of ears and hearing without abnormal findings: Secondary | ICD-10-CM | POA: Diagnosis not present

## 2022-11-17 DIAGNOSIS — H6123 Impacted cerumen, bilateral: Secondary | ICD-10-CM | POA: Diagnosis not present

## 2022-11-17 NOTE — Progress Notes (Signed)
Patient ID: Amanda Hawkins, female   DOB: Nov 19, 1970, 52 y.o.   MRN: [id number]  CC: Decreased hearing, clogging sensation in ears  HPI: The patient is a 52 year old female who presents today complaining of decreased hearing for the past few months.  She also complains of a clogging sensation in her ears.  She denies any significant otalgia, otorrhea, or vertigo.  She has a history of recurrent childhood otitis media.  She underwent bilateral myringotomy and tube placement as a child.  She denies any recent otitis media or otitis externa.  She has never worn hearing aids.  Past Medical History:  Diagnosis Date   Breast cancer (HCC)    left breast DCIS   Family history of breast cancer    Hypothyroidism     Past Surgical History:  Procedure Laterality Date   BREAST BIOPSY Left 06/23/2020   MRI   BREAST BIOPSY Right 2017   BREAST RECONSTRUCTION WITH PLACEMENT OF TISSUE EXPANDER AND ALLODERM Bilateral 08/24/2020   Procedure: BILATERAL BREAST RECONSTRUCTION WITH PLACEMENT OF TISSUE EXPANDER AND ALLODERM;  Surgeon: Glenna Fellows, MD;  Location: [facility];  Service: Plastics;  Laterality: Bilateral;   CESAREAN SECTION     DEBRIDEMENT AND CLOSURE WOUND Bilateral 09/09/2020   Procedure: DEBRIDEMENT BILATERAL MASTECTOMY FLAPS WITH TISSUE EXPANSION;  Surgeon: Glenna Fellows, MD;  Location: [facility];  Service: Plastics;  Laterality: Bilateral;   NIPPLE SPARING MASTECTOMY Right 08/24/2020   Procedure: RIGHT NIPPLE SPARING MASTECTOMY;  Surgeon: Manus Rudd, MD;  Location: [facility];  Service: General;  Laterality: Right;   NIPPLE SPARING MASTECTOMY WITH SENTINEL LYMPH NODE BIOPSY Left 08/24/2020   Procedure: LEFT NIPPLE SPARING MASTECTOMY WITH SENTINEL LYMPH NODE BIOPSY;  Surgeon: Manus Rudd, MD;  Location: [facility];  Service: General;  Laterality: Left;   REMOVAL OF BILATERAL TISSUE EXPANDERS WITH PLACEMENT OF BILATERAL BREAST  IMPLANTS Bilateral 11/23/2020   Procedure: REMOVAL OF BILATERAL TISSUE EXPANDERS WITH PLACEMENT OF BILATERAL BREAST IMPLANTS;  Surgeon: Glenna Fellows, MD;  Location: [facility];  Service: Plastics;  Laterality: Bilateral;    Family History  Problem Relation Age of Onset   Breast cancer Mother 106       second dx at 74   Leukemia Mother    COPD Maternal Grandmother    Cancer Paternal Grandmother    Tuberculosis Paternal Grandfather     Social History:  reports that she has never smoked. She has never used smokeless tobacco. She reports that she does not currently use alcohol. She reports that she does not use drugs.  Allergies: No Known Allergies  Prior to Admission medications   Medication Sig Start Date End Date Taking? Authorizing Provider  TESTOSTERONE NA testosterone 2% cream  APPLY 1 OR 2 CLICKS (0.25-0.5 GRAMS) BEHIND THE KNEE DAILY.   Yes [provider]  thyroid (ARMOUR) 60 MG tablet NP Thyroid 60 mg tablet  TAKE 1 TABLET ONCE DAILY ON EVEN DAYS AND TAKE 1&1/2 TABLETS ONCE DAILY ON ODD DAYS.   Yes [provider]  valACYclovir (VALTREX) 1000 MG tablet Take 1,000 mg by mouth 2 (two) times daily.   Yes [provider]  ALPRAZolam (XANAX) 0.25 MG tablet Take 0.25 mg by mouth at bedtime as needed for anxiety. Patient not taking: Reported on 11/17/2022    [provider]  ibuprofen (ADVIL) 400 MG tablet Take 400 mg by mouth every 6 (six) hours as needed. Patient not taking: Reported on 11/17/2022  [provider]  oxyCODONE (ROXICODONE) 5 MG immediate release tablet Take 1 tablet (5 mg total) by mouth every 4 (four) hours as needed for severe pain. Patient not taking: Reported on 11/17/2022 11/23/20   Glenna Fellows, MD   Height 5\' 5"  (1.651 m), weight 135 lb (61.2 kg). Exam: General: Communicates without difficulty, well nourished, no acute distress. Head: Normocephalic, no evidence injury, no tenderness, facial  buttresses intact without stepoff. Face/sinus: No tenderness to palpation and percussion. Facial movement is normal and symmetric. Eyes: PERRL, EOMI. No scleral icterus, conjunctivae clear. Neuro: CN II exam reveals vision grossly intact.  No nystagmus at any point of gaze. Ears: Auricles well formed without lesions. EAC: Bilateral cerumen impaction.  Under the operating microscope, the cerumen is carefully removed with a combination of cerumen currette, alligator forceps, and suction catheters.  After the cerumen is removed, the TMs are noted to be normal.   Nose: External evaluation reveals normal support and skin without lesions.  Dorsum is intact.  Anterior rhinoscopy reveals congested mucosa over anterior aspect of inferior turbinates and intact septum.  No purulence noted. Oral:  Oral cavity and oropharynx are intact, symmetric, without erythema or edema.  Mucosa is moist without lesions. Neck: Full range of motion without pain.  There is no significant lymphadenopathy.  No masses palpable.  Thyroid bed within normal limits to palpation.  Parotid glands and submandibular glands equal bilaterally without mass.  Trachea is midline. Neuro:  CN 2-12 grossly intact.    Her hearing test shows normal hearing bilaterally across all frequencies.  Assessment: 1.  Bilateral cerumen impaction, causing transient conductive hearing loss. 2.  After the cerumen disimpaction procedure, her tympanic membranes and middle ear spaces are noted to be normal.  Her hearing tests is noted to be normal bilaterally across all frequencies.  Plan: 1.  Otomicroscopy with bilateral cerumen disimpaction. 2.  The physical exam findings and the hearing test results are reviewed with the patient. 3.  The patient is instructed not to use Q-tips to clean the ear canals. 4.  The patient will return for reevaluation in 6 months.  [name] 11/17/2022, 1:50 PM

## 2022-11-17 NOTE — Addendum Note (Signed)
Addended bySuszanne Conners, Veasna Santibanez on: 11/17/2022 02:06 PM   Modules accepted: Orders

## 2022-11-17 NOTE — Progress Notes (Signed)
  [address] [place], [place] [address] [phone]  Audiological Evaluation    Name: English Tomer     DOB:   1970-05-07      MRN:   [id number]                                                                                     Service Date: 11/17/2022        Patient was referred today for a hearing evaluation by Dr. Karle Barr.   Symptoms Yes Details  Hearing loss  []  Patient denied perceiving any hearing loss.  Tinnitus  []  Patient denied experiencing tinnitus.  Balance problems  []  Patient denied vertigo/imbalance sensations.  Previous ear surgeries  []  Patient denied any previous ear surgeries.  Amplification  []  Patient denied the use of hearing aids.    Tympanogram: Right ear: Normal external ear canal volume with normal middle ear pressure and tympanic membrane compliance (Type A). Left ear: Normal external ear canal volume with normal middle ear pressure and tympanic membrane compliance (Type A).    Hearing Evaluation: The audiogram was completed using conventional audiometric techniques under headphones with good reliability.   The hearing test results indicate: Right ear: Normal hearing sensitivity from [phone] Hz. Left ear: Normal hearing sensitivity from [phone] Hz.  Speech Audiometry: Right ear- Speech Reception Threshold (SRT) was obtained at 15 dBHL. Left ear- Speech Reception Threshold (SRT) was obtained at 15 dBHL.   Word Recognition Score Tested using NU-6 (MLV) Right ear: 100% was obtained at a presentation level of 55 dBHL which is deemed as excellent understanding. Left ear: 100% was obtained at a presentation level of 55 dBHL which is deemed as excellent understanding.    Impression:  There is not a significant difference between puretone thresholds and word recognition scores between ears.   Recommendations: Repeat audiogram when changes are perceived or per MD.   Conley Rolls Kyrah Schiro, AUD, CCC-A 11/17/22

## 2023-05-17 ENCOUNTER — Encounter (INDEPENDENT_AMBULATORY_CARE_PROVIDER_SITE_OTHER): Payer: Self-pay

## 2023-05-17 ENCOUNTER — Ambulatory Visit (INDEPENDENT_AMBULATORY_CARE_PROVIDER_SITE_OTHER): Payer: No Typology Code available for payment source | Admitting: Otolaryngology

## 2023-05-17 VITALS — Ht 65.0 in | Wt 130.0 lb

## 2023-05-17 DIAGNOSIS — Z8669 Personal history of other diseases of the nervous system and sense organs: Secondary | ICD-10-CM

## 2023-05-17 DIAGNOSIS — H900 Conductive hearing loss, bilateral: Secondary | ICD-10-CM

## 2023-05-17 DIAGNOSIS — Z09 Encounter for follow-up examination after completed treatment for conditions other than malignant neoplasm: Secondary | ICD-10-CM | POA: Diagnosis not present

## 2023-05-17 DIAGNOSIS — H6123 Impacted cerumen, bilateral: Secondary | ICD-10-CM

## 2023-05-19 NOTE — Progress Notes (Signed)
 Patient ID: Amanda Hawkins, female   DOB: [birth year], 53 y.o.   MRN: [id number]  Follow-up: Hearing loss, cerumen impaction  HPI: The patient is a 53 year old female who returns today for her follow-up evaluation.  The patient was last seen in November 2024.  At that time, she was noted to have bilateral cerumen impaction, causing transient conductive hearing loss.  She was treated with cerumen disimpaction.  The patient returns today for reporting no significant otalgia, otorrhea, or change in her hearing.  Exam: General: Communicates without difficulty, well nourished, no acute distress. Head: Normocephalic, no evidence injury, no tenderness, facial buttresses intact without stepoff. Face/sinus: No tenderness to palpation and percussion. Facial movement is normal and symmetric. Eyes: PERRL, EOMI. No scleral icterus, conjunctivae clear. Neuro: CN II exam reveals vision grossly intact.  No nystagmus at any point of gaze. Ears: Auricles well formed without lesions.  Ear canals are intact without mass or lesion.  No erythema or edema is appreciated.  The TMs are intact without fluid. Nose: External evaluation reveals normal support and skin without lesions.  Dorsum is intact.  Anterior rhinoscopy reveals normal mucosa over anterior aspect of inferior turbinates and intact septum.  No purulence noted. Oral:  Oral cavity and oropharynx are intact, symmetric, without erythema or edema.  Mucosa is moist without lesions. Neck: Full range of motion without pain.  There is no significant lymphadenopathy.  No masses palpable.  Thyroid  bed within normal limits to palpation.  Parotid glands and submandibular glands equal bilaterally without mass.  Trachea is midline. Neuro:  CN 2-12 grossly intact.   Assessment: 1.  No significant cerumen impaction is noted today. 2.  The rest of her ENT exam is also normal.  Plan: 1.  The physical exam findings are reviewed with the patient. 2.  The patient is instructed not to use  Q-tips to clean the ear canals. 3.  The patient is encouraged to call with any questions or concerns.
